# Patient Record
Sex: Male | Born: 1940 | Race: White | Hispanic: No | Marital: Married | Smoking: Former smoker
Health system: Southern US, Community
[De-identification: ages and names within clinical notes are randomized; demographics above are authoritative.]

## PROBLEM LIST (undated history)

## (undated) DIAGNOSIS — I4891 Unspecified atrial fibrillation: Secondary | ICD-10-CM

## (undated) DIAGNOSIS — I10 Essential (primary) hypertension: Secondary | ICD-10-CM

## (undated) DIAGNOSIS — I2510 Atherosclerotic heart disease of native coronary artery without angina pectoris: Secondary | ICD-10-CM

## (undated) DIAGNOSIS — M5136 Other intervertebral disc degeneration, lumbar region: Secondary | ICD-10-CM

## (undated) DIAGNOSIS — E119 Type 2 diabetes mellitus without complications: Secondary | ICD-10-CM

## (undated) DIAGNOSIS — M51369 Other intervertebral disc degeneration, lumbar region without mention of lumbar back pain or lower extremity pain: Secondary | ICD-10-CM

## (undated) HISTORY — PX: ASD REPAIR: SHX258

## (undated) HISTORY — PX: APPENDECTOMY: SHX54

## (undated) HISTORY — PX: HERNIA REPAIR: SHX51

## (undated) HISTORY — PX: PROSTATE SURGERY: SHX751

## (undated) HISTORY — PX: PACEMAKER INSERTION: SHX728

## (undated) HISTORY — PX: CARDIAC ELECTROPHYSIOLOGY MAPPING AND ABLATION: SHX1292

---

## 2011-08-24 DIAGNOSIS — I484 Atypical atrial flutter: Secondary | ICD-10-CM | POA: Insufficient documentation

## 2011-08-24 DIAGNOSIS — I4891 Unspecified atrial fibrillation: Secondary | ICD-10-CM | POA: Insufficient documentation

## 2012-04-25 DIAGNOSIS — G4730 Sleep apnea, unspecified: Secondary | ICD-10-CM | POA: Insufficient documentation

## 2012-04-25 DIAGNOSIS — Z8774 Personal history of (corrected) congenital malformations of heart and circulatory system: Secondary | ICD-10-CM | POA: Insufficient documentation

## 2012-04-25 DIAGNOSIS — Z950 Presence of cardiac pacemaker: Secondary | ICD-10-CM | POA: Insufficient documentation

## 2012-07-13 DIAGNOSIS — F329 Major depressive disorder, single episode, unspecified: Secondary | ICD-10-CM | POA: Insufficient documentation

## 2012-09-13 DIAGNOSIS — E1151 Type 2 diabetes mellitus with diabetic peripheral angiopathy without gangrene: Secondary | ICD-10-CM | POA: Insufficient documentation

## 2012-09-16 DIAGNOSIS — Z7901 Long term (current) use of anticoagulants: Secondary | ICD-10-CM | POA: Insufficient documentation

## 2012-09-16 DIAGNOSIS — I2510 Atherosclerotic heart disease of native coronary artery without angina pectoris: Secondary | ICD-10-CM | POA: Insufficient documentation

## 2015-09-11 DIAGNOSIS — I10 Essential (primary) hypertension: Secondary | ICD-10-CM | POA: Insufficient documentation

## 2015-09-11 DIAGNOSIS — E785 Hyperlipidemia, unspecified: Secondary | ICD-10-CM | POA: Insufficient documentation

## 2015-09-20 DIAGNOSIS — M67911 Unspecified disorder of synovium and tendon, right shoulder: Secondary | ICD-10-CM | POA: Insufficient documentation

## 2015-12-05 DIAGNOSIS — N401 Enlarged prostate with lower urinary tract symptoms: Secondary | ICD-10-CM

## 2015-12-05 DIAGNOSIS — N138 Other obstructive and reflux uropathy: Secondary | ICD-10-CM | POA: Insufficient documentation

## 2016-03-30 DIAGNOSIS — R413 Other amnesia: Secondary | ICD-10-CM | POA: Insufficient documentation

## 2016-03-30 DIAGNOSIS — G250 Essential tremor: Secondary | ICD-10-CM | POA: Insufficient documentation

## 2016-03-31 DIAGNOSIS — M545 Low back pain, unspecified: Secondary | ICD-10-CM | POA: Insufficient documentation

## 2016-03-31 DIAGNOSIS — M79605 Pain in left leg: Secondary | ICD-10-CM | POA: Insufficient documentation

## 2016-04-02 DIAGNOSIS — M7741 Metatarsalgia, right foot: Secondary | ICD-10-CM | POA: Insufficient documentation

## 2016-04-21 ENCOUNTER — Ambulatory Visit (INDEPENDENT_AMBULATORY_CARE_PROVIDER_SITE_OTHER): Payer: Medicare Other | Admitting: Family Medicine

## 2016-04-21 ENCOUNTER — Encounter: Payer: Self-pay | Admitting: Family Medicine

## 2016-04-21 DIAGNOSIS — M5442 Lumbago with sciatica, left side: Secondary | ICD-10-CM

## 2016-04-21 MED ORDER — PREDNISONE 10 MG PO TABS: ORAL_TABLET | ORAL | 0 refills | Status: DC

## 2016-04-21 NOTE — Patient Instructions (Signed)
We are checking with Dr. Garfield CorneaLischke about the prednisone - Ideally we would do this, you'd improve and we would then start physical therapy for your spinal stenosis. It's ok to take tylenol, the muscle relaxant also (though I know these make you sleepy). If we cannot do this we may have to do a CT myelogram of your back though this is a lot of radiation - I'd prefer to avoid this if at all possible.

## 2016-04-22 ENCOUNTER — Ambulatory Visit: Payer: Self-pay | Admitting: Family Medicine

## 2016-04-23 NOTE — Assessment & Plan Note (Signed)
history and exam classic for spinal stenosis - likely from combination of arthritis, disc bulging.  We checked with his PCP about prednisone - they said this was ok and will plan to check his INR this week sometime.  If not improving we can consider a CT myelogram (no MRI with his pacemaker) but discussed this is a lot of radiation, prefer to avoid this.  Follow up in a week and hopefully he's improving and we can start him in physical therapy.

## 2016-04-23 NOTE — Progress Notes (Signed)
PCP: Dr. Elwyn LadeAmie Chan  Subjective:   HPI: Patient is a 75 y.o. male here for low back, left leg pain.  Patient reports for about 6 weeks now he's had relatively severe low back and left leg pain. Pain is 7/10 level, sharp. Started just one morning when he woke up with this pain. Worse with extension of back, better with sitting and leaning forward. No numbness or tingling. Pain goes down to lower thigh level. No skin changes.  No past medical history on file.  No current outpatient prescriptions on file prior to visit.   No current facility-administered medications on file prior to visit.     No past surgical history on file.  Allergies  Allergen Reactions  . Clarithromycin Palpitations and Other (See Comments)    BIAXIN ;  DOES NOT INTERACT WITH CURRENT MEDICATIONS  . Other Other (See Comments)  . Penicillins     Social History   Social History  . Marital status: Married    Spouse name: N/A  . Number of children: N/A  . Years of education: N/A   Occupational History  . Not on file.   Social History Main Topics  . Smoking status: Never Smoker  . Smokeless tobacco: Never Used  . Alcohol use Not on file  . Drug use: Unknown  . Sexual activity: Not on file   Other Topics Concern  . Not on file   Social History Narrative  . No narrative on file    No family history on file.  BP (!) 145/79   Pulse 71   Ht 5\' 9"  (1.753 m)   Wt 186 lb (84.4 kg)   BMI 27.47 kg/m   Review of Systems: See HPI above.    Objective:  Physical Exam:  Gen: NAD, comfortable in exam room  Back: No gross deformity, scoliosis. TTP mildly left lumbar paraspinal region.  No midline or bony TTP. FROM with pain on extension, radiation into left leg. Strength LEs 5/5 all muscle groups. 2+ MSRs in patellar and achilles tendons, equal bilaterally. Mild positive SLR on left, negative right. Sensation intact to light touch bilaterally. Negative logroll bilateral hips Negative  fabers and piriformis stretches.  Assessment & Plan:  1. Low back pain - history and exam classic for spinal stenosis - likely from combination of arthritis, disc bulging.  We checked with his PCP about prednisone - they said this was ok and will plan to check his INR this week sometime.  If not improving we can consider a CT myelogram (no MRI with his pacemaker) but discussed this is a lot of radiation, prefer to avoid this.  Follow up in a week and hopefully he's improving and we can start him in physical therapy.

## 2016-04-29 ENCOUNTER — Encounter: Payer: Self-pay | Admitting: Family Medicine

## 2016-04-29 ENCOUNTER — Ambulatory Visit (INDEPENDENT_AMBULATORY_CARE_PROVIDER_SITE_OTHER): Payer: Medicare Other | Admitting: Family Medicine

## 2016-04-29 ENCOUNTER — Ambulatory Visit (HOSPITAL_BASED_OUTPATIENT_CLINIC_OR_DEPARTMENT_OTHER)
Admission: RE | Admit: 2016-04-29 | Discharge: 2016-04-29 | Disposition: A | Discharge status: Home / Self Care | Payer: Medicare Other | Source: Ambulatory Visit | Attending: Family Medicine | Admitting: Family Medicine

## 2016-04-29 VITALS — BP 124/72 | HR 71 | Ht 69.0 in | Wt 188.0 lb

## 2016-04-29 DIAGNOSIS — M5442 Lumbago with sciatica, left side: Secondary | ICD-10-CM

## 2016-04-29 DIAGNOSIS — M4806 Spinal stenosis, lumbar region: Secondary | ICD-10-CM | POA: Insufficient documentation

## 2016-04-29 DIAGNOSIS — M5126 Other intervertebral disc displacement, lumbar region: Secondary | ICD-10-CM | POA: Insufficient documentation

## 2016-04-29 DIAGNOSIS — I700 Atherosclerosis of aorta: Secondary | ICD-10-CM | POA: Insufficient documentation

## 2016-04-29 DIAGNOSIS — M5136 Other intervertebral disc degeneration, lumbar region: Secondary | ICD-10-CM | POA: Insufficient documentation

## 2016-04-29 NOTE — Patient Instructions (Signed)
We will go ahead with a CT scan of your lumbar spine. I typically call you the business day following this to go over results and next steps. We discussed epidural injection, physical therapy, nortriptyline (a medication) as possibilities in the future.

## 2016-04-30 NOTE — Assessment & Plan Note (Signed)
history and exam classic for spinal stenosis - likely from combination of arthritis, disc bulging.  Only improved for about 1 day with the oral prednisone.  We discussed options again - will start with a standard CT scan to assess for spinal stenosis, ensure no occult compression fracture is present.  We discussed PT, possible injection(s), nortriptyline.

## 2016-04-30 NOTE — Progress Notes (Addendum)
PCP: Dr. Elwyn Lade  Subjective:   HPI: Patient is a 75 y.o. male here for low back, left leg pain.  9/12: Patient reports for about 6 weeks now he's had relatively severe low back and left leg pain. Pain is 7/10 level, sharp. Started just one morning when he woke up with this pain. Worse with extension of back, better with sitting and leaning forward. No numbness or tingling. Pain goes down to lower thigh level. No skin changes.  9/20: Patient reports he feels about the same compared to last visit. Maybe helped the first day by the prednisone only. Can't sleep on left side. Still radiating across his back and down the left leg. No skin changes. Pain 7/10 level, sharp. No numbness.  No past medical history on file.  Current Outpatient Prescriptions on File Prior to Visit  Medication Sig Dispense Refill  . citalopram (CELEXA) 20 MG tablet TK 1 T PO QD  3  . dofetilide (TIKOSYN) 500 MCG capsule Take by mouth.    . losartan (COZAAR) 50 MG tablet   1  . metFORMIN (GLUCOPHAGE-XR) 500 MG 24 hr tablet TAKE 2 TS PO BID  0  . NIFEdipine (PROCARDIA-XL/ADALAT-CC/NIFEDICAL-XL) 30 MG 24 hr tablet   0  . predniSONE (DELTASONE) 10 MG tablet 6 tabs po day 1, 5 tabs po day 2, 4 tabs po day 3, 3 tabs po day 4, 2 tabs po day 5, 1 tab po day 6 21 tablet 0  . simvastatin (ZOCOR) 40 MG tablet   3  . tamsulosin (FLOMAX) 0.4 MG CAPS capsule   0  . warfarin (COUMADIN) 5 MG tablet   0   No current facility-administered medications on file prior to visit.     No past surgical history on file.  Allergies  Allergen Reactions  . Clarithromycin Palpitations and Other (See Comments)    BIAXIN ;  DOES NOT INTERACT WITH CURRENT MEDICATIONS  . Other Other (See Comments)  . Penicillins     Social History   Social History  . Marital status: Married    Spouse name: N/A  . Number of children: N/A  . Years of education: N/A   Occupational History  . Not on file.   Social History Main Topics   . Smoking status: Never Smoker  . Smokeless tobacco: Never Used  . Alcohol use Not on file  . Drug use: Unknown  . Sexual activity: Not on file   Other Topics Concern  . Not on file   Social History Narrative  . No narrative on file    No family history on file.  BP 124/72   Pulse 71   Ht 5\' 9"  (1.753 m)   Wt 188 lb (85.3 kg)   BMI 27.76 kg/m   Review of Systems: See HPI above.    Objective:  Physical Exam:  Gen: NAD, comfortable in exam room  Back: No gross deformity, scoliosis. TTP mildly left lumbar paraspinal region.  No midline or bony TTP. FROM with pain on extension, radiation into left leg. Strength LEs 5/5 all muscle groups. 2+ MSRs in patellar and achilles tendons, equal bilaterally. Mild positive SLR on left, negative right. Sensation intact to light touch bilaterally. Negative logroll bilateral hips Negative fabers and piriformis stretches.  Assessment & Plan:  1. Low back pain - history and exam classic for spinal stenosis - likely from combination of arthritis, disc bulging.  Only improved for about 1 day with the oral prednisone.  We discussed options  again - will start with a standard CT scan to assess for spinal stenosis, ensure no occult compression fracture is present.  We discussed PT, possible injection(s), nortriptyline.    Addendum:  CT reviewed and discussed with patient.  He has a moderate sized disc bulge at L4-5 level with some stenosis here.  Based on his symptoms I suspect this is most likely cause of his current issues.  We again discussed nortriptyline, PT, injection.  He would like to go ahead with injection - will call us a week after this to let us know how he's doing.  Hopefully we can start PT at that time.

## 2016-05-01 ENCOUNTER — Other Ambulatory Visit: Payer: Self-pay | Admitting: *Deleted

## 2016-05-01 ENCOUNTER — Other Ambulatory Visit: Payer: Self-pay | Admitting: Family Medicine

## 2016-05-01 DIAGNOSIS — M5442 Lumbago with sciatica, left side: Secondary | ICD-10-CM

## 2016-05-14 ENCOUNTER — Ambulatory Visit
Admission: RE | Admit: 2016-05-14 | Discharge: 2016-05-14 | Disposition: A | Discharge status: Home / Self Care | Payer: Medicare Other | Source: Ambulatory Visit | Attending: Family Medicine | Admitting: Family Medicine

## 2016-05-14 DIAGNOSIS — M5442 Lumbago with sciatica, left side: Secondary | ICD-10-CM

## 2016-05-14 MED ORDER — IOPAMIDOL (ISOVUE-M 200) INJECTION 41%: 1.0000 mL | Freq: Once | INTRAMUSCULAR | Status: DC

## 2016-05-14 MED ORDER — METHYLPREDNISOLONE ACETATE 40 MG/ML INJ SUSP (RADIOLOG: 120.0000 mg | Freq: Once | INTRAMUSCULAR | Status: DC

## 2016-05-14 NOTE — Discharge Instructions (Signed)

## 2016-05-15 ENCOUNTER — Ambulatory Visit
Admission: RE | Admit: 2016-05-15 | Discharge: 2016-05-15 | Disposition: A | Discharge status: Home / Self Care | Payer: Medicare Other | Source: Ambulatory Visit | Attending: Family Medicine | Admitting: Family Medicine

## 2016-05-15 MED ORDER — IOPAMIDOL (ISOVUE-M 200) INJECTION 41%
1.0000 mL | Freq: Once | INTRAMUSCULAR | Status: AC
Start: 2016-05-15 — End: 2016-05-15
  Administered 2016-05-15: 1 mL via EPIDURAL

## 2016-05-15 MED ORDER — METHYLPREDNISOLONE ACETATE 40 MG/ML INJ SUSP (RADIOLOG
120.0000 mg | Freq: Once | INTRAMUSCULAR | Status: AC
  Administered 2016-05-15: 120 mg via EPIDURAL

## 2016-05-15 NOTE — Discharge Instructions (Addendum)

## 2016-05-27 ENCOUNTER — Telehealth: Payer: Self-pay | Admitting: Family Medicine

## 2016-05-27 NOTE — Telephone Encounter (Signed)
Called PT and appointment has been set.

## 2016-06-12 ENCOUNTER — Telehealth: Payer: Self-pay | Admitting: Family Medicine

## 2016-06-12 DIAGNOSIS — M48062 Spinal stenosis, lumbar region with neurogenic claudication: Secondary | ICD-10-CM | POA: Insufficient documentation

## 2016-06-12 NOTE — Telephone Encounter (Signed)
Spoke to patient and gave him information provided by the physician. Patient would like to do another injection. Will set up for patient.

## 2016-06-12 NOTE — Telephone Encounter (Signed)
I would recommend continuing with the therapy and considering repeating the injection - you can do up to 3 shots spaced 2 weeks apart and some people need multiple injections plus the therapy to get relief.

## 2016-06-15 ENCOUNTER — Telehealth: Payer: Self-pay | Admitting: Family Medicine

## 2016-06-16 ENCOUNTER — Other Ambulatory Visit: Payer: Self-pay | Admitting: Family Medicine

## 2016-06-16 DIAGNOSIS — M48061 Spinal stenosis, lumbar region without neurogenic claudication: Secondary | ICD-10-CM

## 2016-06-16 NOTE — Telephone Encounter (Signed)
Appointment has been set.

## 2016-06-19 ENCOUNTER — Other Ambulatory Visit: Payer: Medicare Other

## 2016-06-19 ENCOUNTER — Ambulatory Visit
Admission: RE | Admit: 2016-06-19 | Discharge: 2016-06-19 | Disposition: A | Discharge status: Home / Self Care | Payer: Medicare Other | Source: Ambulatory Visit | Attending: Family Medicine | Admitting: Family Medicine

## 2016-06-19 DIAGNOSIS — M48061 Spinal stenosis, lumbar region without neurogenic claudication: Secondary | ICD-10-CM

## 2016-06-19 MED ORDER — IOPAMIDOL (ISOVUE-M 200) INJECTION 41%
1.0000 mL | Freq: Once | INTRAMUSCULAR | Status: AC
  Administered 2016-06-19: 1 mL via EPIDURAL

## 2016-06-19 MED ORDER — METHYLPREDNISOLONE ACETATE 40 MG/ML INJ SUSP (RADIOLOG
120.0000 mg | Freq: Once | INTRAMUSCULAR | Status: AC
  Administered 2016-06-19: 120 mg via EPIDURAL

## 2016-07-20 ENCOUNTER — Telehealth: Payer: Self-pay | Admitting: Family Medicine

## 2016-07-20 NOTE — Telephone Encounter (Signed)
Spoke to patient and made him an appointment for his back. Still having some issues.

## 2016-07-21 ENCOUNTER — Ambulatory Visit: Payer: Medicare Other | Admitting: Family Medicine

## 2016-07-30 ENCOUNTER — Encounter: Payer: Self-pay | Admitting: Family Medicine

## 2016-07-30 ENCOUNTER — Ambulatory Visit (INDEPENDENT_AMBULATORY_CARE_PROVIDER_SITE_OTHER): Payer: Medicare Other | Admitting: Family Medicine

## 2016-07-30 DIAGNOSIS — M5442 Lumbago with sciatica, left side: Secondary | ICD-10-CM

## 2016-07-30 DIAGNOSIS — G8929 Other chronic pain: Secondary | ICD-10-CM | POA: Diagnosis not present

## 2016-07-30 NOTE — Patient Instructions (Signed)
Continue the physical therapy and home exercises. Call me if you want to do the third cortisone injection for your back (I'd give it about 2 more weeks of therapy if you can before considering this). The nerve-blocking medications, neurosurgery referral are other considerations. Follow up with me in 6 weeks otherwise but call me sooner about the above.

## 2016-07-31 NOTE — Progress Notes (Signed)
PCP: Dr. Elwyn LadeAmie Chan  Subjective:   HPI: Patient is a 75 y.o. male here for low back, left leg pain.  9/12: Patient reports for about 6 weeks now he's had relatively severe low back and left leg pain. Pain is 7/10 level, sharp. Started just one morning when he woke up with this pain. Worse with extension of back, better with sitting and leaning forward. No numbness or tingling. Pain goes down to lower thigh level. No skin changes.  9/20: Patient reports he feels about the same compared to last visit. Maybe helped the first day by the prednisone only. Can't sleep on left side. Still radiating across his back and down the left leg. No skin changes. Pain 7/10 level, sharp. No numbness.  12/21: Patient reports he has improved since last visit. About 40-50% improved following PT, ESIs x 2. Pain level 3/10, radiates into both legs. Worse in the morning. Eases off with flexion, using shopping cart, and as day goes on. No bowel/bladder dysfunction. No numbness. No skin changes.  No past medical history on file.  Current Outpatient Prescriptions on File Prior to Visit  Medication Sig Dispense Refill  . citalopram (CELEXA) 20 MG tablet TK 1 T PO QD  3  . dofetilide (TIKOSYN) 500 MCG capsule Take by mouth.    . losartan (COZAAR) 50 MG tablet   1  . metFORMIN (GLUCOPHAGE-XR) 500 MG 24 hr tablet TAKE 2 TS PO BID  0  . NIFEdipine (PROCARDIA-XL/ADALAT-CC/NIFEDICAL-XL) 30 MG 24 hr tablet   0  . predniSONE (DELTASONE) 10 MG tablet 6 tabs po day 1, 5 tabs po day 2, 4 tabs po day 3, 3 tabs po day 4, 2 tabs po day 5, 1 tab po day 6 21 tablet 0  . simvastatin (ZOCOR) 40 MG tablet   3  . tamsulosin (FLOMAX) 0.4 MG CAPS capsule   0  . warfarin (COUMADIN) 5 MG tablet   0   No current facility-administered medications on file prior to visit.     No past surgical history on file.  Allergies  Allergen Reactions  . Clarithromycin Palpitations and Other (See Comments)    BIAXIN ;  DOES  NOT INTERACT WITH CURRENT MEDICATIONS  . Other Other (See Comments)  . Penicillins     Social History   Social History  . Marital status: Married    Spouse name: N/A  . Number of children: N/A  . Years of education: N/A   Occupational History  . Not on file.   Social History Main Topics  . Smoking status: Never Smoker  . Smokeless tobacco: Never Used  . Alcohol use Not on file  . Drug use: Unknown  . Sexual activity: Not on file   Other Topics Concern  . Not on file   Social History Narrative  . No narrative on file    No family history on file.  BP (!) 145/78   Pulse 71   Ht 5\' 9"  (1.753 m)   Wt 187 lb (84.8 kg)   BMI 27.62 kg/m   Review of Systems: See HPI above.    Objective:  Physical Exam:  Gen: NAD, comfortable in exam room  Back: No gross deformity, scoliosis. No TTP currently.  No midline or bony TTP. FROM with pain on extension minimally. Strength LEs 5/5 all muscle groups. 2+ MSRs in patellar and achilles tendons, equal bilaterally. Negative SLRs. Sensation intact to light touch bilaterally. Negative logroll bilateral hips  Assessment & Plan:  1.  Low back pain - 2/2 spinal stenosis at L4-5 level with moderate disc bulge.  S/p 2 ESIs and physical therapy.  Continue with home exercises.  He will consider third ESI, nortriptyline, neurosurgery referral if he doesn't continue to improve.  Tylenol if needed.  F/u in 6 weeks otherwise.

## 2016-07-31 NOTE — Assessment & Plan Note (Signed)
2/2 spinal stenosis at L4-5 level with moderate disc bulge.  S/p 2 ESIs and physical therapy.  Continue with home exercises.  He will consider third ESI, nortriptyline, neurosurgery referral if he doesn't continue to improve.  Tylenol if needed.  F/u in 6 weeks otherwise.

## 2016-09-03 ENCOUNTER — Telehealth: Payer: Self-pay | Admitting: Family Medicine

## 2016-09-03 NOTE — Telephone Encounter (Signed)
Ok to go ahead with this - thanks! 

## 2016-09-06 ENCOUNTER — Encounter (HOSPITAL_BASED_OUTPATIENT_CLINIC_OR_DEPARTMENT_OTHER): Payer: Self-pay | Admitting: *Deleted

## 2016-09-06 ENCOUNTER — Emergency Department (HOSPITAL_BASED_OUTPATIENT_CLINIC_OR_DEPARTMENT_OTHER)
Admission: EM | Admit: 2016-09-06 | Discharge: 2016-09-06 | Disposition: A | Discharge status: Home / Self Care | Payer: Medicare Other | Attending: Emergency Medicine | Admitting: Emergency Medicine

## 2016-09-06 DIAGNOSIS — S39012A Strain of muscle, fascia and tendon of lower back, initial encounter: Secondary | ICD-10-CM | POA: Diagnosis not present

## 2016-09-06 DIAGNOSIS — Y939 Activity, unspecified: Secondary | ICD-10-CM | POA: Diagnosis not present

## 2016-09-06 DIAGNOSIS — Z7901 Long term (current) use of anticoagulants: Secondary | ICD-10-CM | POA: Diagnosis not present

## 2016-09-06 DIAGNOSIS — I10 Essential (primary) hypertension: Secondary | ICD-10-CM | POA: Insufficient documentation

## 2016-09-06 DIAGNOSIS — Z79899 Other long term (current) drug therapy: Secondary | ICD-10-CM | POA: Insufficient documentation

## 2016-09-06 DIAGNOSIS — E119 Type 2 diabetes mellitus without complications: Secondary | ICD-10-CM | POA: Diagnosis not present

## 2016-09-06 DIAGNOSIS — W11XXXA Fall on and from ladder, initial encounter: Secondary | ICD-10-CM | POA: Diagnosis not present

## 2016-09-06 DIAGNOSIS — S20222A Contusion of left back wall of thorax, initial encounter: Secondary | ICD-10-CM | POA: Diagnosis not present

## 2016-09-06 DIAGNOSIS — Y929 Unspecified place or not applicable: Secondary | ICD-10-CM | POA: Insufficient documentation

## 2016-09-06 DIAGNOSIS — S3992XA Unspecified injury of lower back, initial encounter: Secondary | ICD-10-CM | POA: Diagnosis present

## 2016-09-06 DIAGNOSIS — Z7984 Long term (current) use of oral hypoglycemic drugs: Secondary | ICD-10-CM | POA: Diagnosis not present

## 2016-09-06 DIAGNOSIS — S5001XA Contusion of right elbow, initial encounter: Secondary | ICD-10-CM | POA: Diagnosis not present

## 2016-09-06 DIAGNOSIS — Y999 Unspecified external cause status: Secondary | ICD-10-CM | POA: Diagnosis not present

## 2016-09-06 DIAGNOSIS — S40021A Contusion of right upper arm, initial encounter: Secondary | ICD-10-CM

## 2016-09-06 DIAGNOSIS — Z5181 Encounter for therapeutic drug level monitoring: Secondary | ICD-10-CM | POA: Diagnosis not present

## 2016-09-06 HISTORY — DX: Essential (primary) hypertension: I10

## 2016-09-06 HISTORY — DX: Type 2 diabetes mellitus without complications: E11.9

## 2016-09-06 HISTORY — DX: Atherosclerotic heart disease of native coronary artery without angina pectoris: I25.10

## 2016-09-06 HISTORY — DX: Unspecified atrial fibrillation: I48.91

## 2016-09-06 HISTORY — DX: Other intervertebral disc degeneration, lumbar region without mention of lumbar back pain or lower extremity pain: M51.369

## 2016-09-06 HISTORY — DX: Other intervertebral disc degeneration, lumbar region: M51.36

## 2016-09-06 LAB — CBC WITH DIFFERENTIAL/PLATELET
Basophils Absolute: 0 10*3/uL (ref 0.0–0.1)
Basophils Relative: 0 %
EOS ABS: 0.2 10*3/uL (ref 0.0–0.7)
EOS PCT: 3 %
HCT: 36.4 % — ABNORMAL LOW (ref 39.0–52.0)
Hemoglobin: 11.8 g/dL — ABNORMAL LOW (ref 13.0–17.0)
LYMPHS ABS: 2 10*3/uL (ref 0.7–4.0)
LYMPHS PCT: 29 %
MCH: 30.6 pg (ref 26.0–34.0)
MCHC: 32.4 g/dL (ref 30.0–36.0)
MCV: 94.3 fL (ref 78.0–100.0)
MONO ABS: 1 10*3/uL (ref 0.1–1.0)
MONOS PCT: 15 %
Neutro Abs: 3.5 10*3/uL (ref 1.7–7.7)
Neutrophils Relative %: 53 %
PLATELETS: 263 10*3/uL (ref 150–400)
RBC: 3.86 MIL/uL — ABNORMAL LOW (ref 4.22–5.81)
RDW: 13.1 % (ref 11.5–15.5)
WBC: 6.7 10*3/uL (ref 4.0–10.5)

## 2016-09-06 LAB — BASIC METABOLIC PANEL
Anion gap: 6 (ref 5–15)
BUN: 17 mg/dL (ref 6–20)
CO2: 28 mmol/L (ref 22–32)
CREATININE: 0.7 mg/dL (ref 0.61–1.24)
Calcium: 8.8 mg/dL — ABNORMAL LOW (ref 8.9–10.3)
Chloride: 103 mmol/L (ref 101–111)
GFR calc Af Amer: >60 mL/min (ref >60)
GLUCOSE: 119 mg/dL — AB (ref 65–99)
POTASSIUM: 4.2 mmol/L (ref 3.5–5.1)
SODIUM: 137 mmol/L (ref 135–145)

## 2016-09-06 LAB — PROTIME-INR
INR: 1.83
PROTHROMBIN TIME: 21.4 s — AB (ref 11.4–15.2)

## 2016-09-06 MED ORDER — ACETAMINOPHEN 500 MG PO TABS
1000.0000 mg | ORAL_TABLET | Freq: Once | ORAL | Status: AC
  Administered 2016-09-06: 1000 mg via ORAL
  Filled 2016-09-06: qty 2

## 2016-09-06 NOTE — ED Notes (Signed)
ED Provider at bedside. 

## 2016-09-06 NOTE — ED Triage Notes (Signed)
Patient states he has a recent diagnosis of herniated disk in his back.  Yesterday stepped off of the last step of a ladder, fell backwards landing on a piece of equipment.  Now has worse back pain, right elbow pain and generalized soreness.

## 2016-09-06 NOTE — ED Notes (Signed)
Pt states fall was approx 2 feet off the ground. Pt thought he was at the bottom and stepped down off the ladder, became off balanced and fell on top of some equipment. No neuro deficits distally, denies incontinence, no point tenderness of spine

## 2016-09-06 NOTE — ED Provider Notes (Signed)
[facility] Provider Note   CSN: [id number] Arrival date & time: 09/06/16  4098     History   Chief Complaint Chief Complaint  Patient presents with  . Fall    HPI Paul Chan is a 76 y.o. male.  The history is provided by the patient.  Fall  This is a new problem. The current episode started 12 to 24 hours ago. The problem occurs constantly. The problem has not changed since onset.Associated symptoms comments: Back pain, right shoulder and arm pain. Nothing aggravates the symptoms. Nothing relieves the symptoms. He has tried acetaminophen for the symptoms. The treatment provided no relief.    Past Medical History:  Diagnosis Date  . A-fib (HCC)   . Coronary artery disease   . DDD (degenerative disc disease), lumbar   . Diabetes mellitus without complication (HCC)   . Hypertension     Patient Active Problem List   Diagnosis Date Noted  . Spinal stenosis of lumbar region with neurogenic claudication 06/12/2016  . Metatarsalgia of right foot 04/02/2016  . Low back pain 03/31/2016  . Leg pain, left 03/31/2016  . Memory impairment 03/30/2016  . 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 Dysfunction of right rotator cuff 09/20/2015  . Hyperlipidemia 09/11/2015  . HTN (hypertension), benign 09/11/2015  . Anticoagulated on Coumadin 09/16/2012  . CAD (coronary artery disease) 09/16/2012  . Type 2 diabetes mellitus (HCC) 09/13/2012  . Depression 07/13/2012  . Sleep apnea 04/25/2012  . H/O atrial septal defect repair 04/25/2012  . Cardiac pacemaker 04/25/2012  . Atrial fibrillation (HCC) 08/24/2011  . Atypical atrial flutter (HCC) 08/24/2011    Past Surgical History:  Procedure Laterality Date  . APPENDECTOMY    . ASD REPAIR    . CARDIAC ELECTROPHYSIOLOGY MAPPING AND ABLATION    . HERNIA REPAIR    . PACEMAKER INSERTION         Home Medications    Prior to Admission medications     Medication Sig Start Date End Date Taking? Authorizing Provider  citalopram (CELEXA) 20 MG tablet TK 1 T PO QD 02/09/16  Yes Historical Provider, MD  dofetilide (TIKOSYN) 500 MCG capsule Take by mouth.   Yes Historical Provider, MD  losartan (COZAAR) 50 MG tablet  03/04/16  Yes Historical Provider, MD  metFORMIN (GLUCOPHAGE-XR) 500 MG 24 hr tablet TAKE 2 TS PO BID 04/14/16  Yes Historical Provider, MD  NIFEdipine (PROCARDIA-XL/ADALAT-CC/NIFEDICAL-XL) 30 MG 24 hr tablet  04/14/16  Yes Historical Provider, MD  simvastatin (ZOCOR) 40 MG tablet  02/10/16  Yes Historical Provider, MD  warfarin (COUMADIN) 5 MG tablet  02/16/16  Yes Historical Provider, MD  predniSONE (DELTASONE) 10 MG tablet 6 tabs po day 1, 5 tabs po day 2, 4 tabs po day 3, 3 tabs po day 4, 2 tabs po day 5, 1 tab po day 6 04/21/16   Lenda Kelp, MD  tamsulosin (FLOMAX) 0.4 MG CAPS capsule  01/21/16   Historical Provider, MD    Family History No family history on file.  Social History Social History  Substance Use Topics  . Smoking status: Never Smoker  . Smokeless tobacco: Never Used  . Alcohol use No     Allergies   Clarithromycin; Other; and Penicillins   Review of Systems Review of Systems  All other systems reviewed and are negative.    Physical Exam Updated Vital Signs BP 143/66 (BP Location: Right Arm)   Pulse 70  Temp 98.7 F (37.1 C) (Oral)   Resp 18   Ht 5\' 9"  (1.753 m)   Wt 180 lb (81.6 kg)   SpO2 98%   BMI 26.58 kg/m   Physical Exam  Constitutional: He is oriented to person, place, and time. He appears well-developed and well-nourished. No distress.  HENT:  Head: Normocephalic and atraumatic.  Nose: Nose normal.  Eyes: Conjunctivae are normal.  Neck: Neck supple. No tracheal deviation present.  Cardiovascular: Normal rate and regular rhythm.   Pulmonary/Chest: Effort normal. No respiratory distress.  Abdominal: Soft. He exhibits no distension.  Musculoskeletal:  Right sided low back  tenderness without ecchymosis, deformity. Right elbow tenderness with small contusion, no decreased ROM or deformity  Neurological: He is alert and oriented to person, place, and time.  Skin: Skin is warm and dry.  Psychiatric: He has a normal mood and affect.     ED Treatments / Results  Labs (all labs ordered are listed, but only abnormal results are displayed) Labs Reviewed  CBC WITH DIFFERENTIAL/PLATELET - Abnormal; Notable for the following:       Result Value   RBC 3.86 (*)    Hemoglobin 11.8 (*)    HCT 36.4 (*)    All other components within normal limits  BASIC METABOLIC PANEL - Abnormal; Notable for the following:    Glucose, Bld 119 (*)    Calcium 8.8 (*)    All other components within normal limits  PROTIME-INR - Abnormal; Notable for the following:    Prothrombin Time 21.4 (*)    All other components within normal limits    EKG  EKG Interpretation None       Radiology No results found.  Procedures Procedures (including critical care time)  Medications Ordered in ED Medications - No data to display   Initial Impression / Assessment and Plan / ED Course  I have reviewed the triage vital signs and the nursing notes.  Pertinent labs & imaging results that were available during my care of the patient were reviewed by me and considered in my medical decision making (see chart for details).     76 y.o. male presents with fall off of a stepladder yesterday. anticagulated on coumadin. No head injury but has contusions evident to arm and back. Discussed possibility of early retroperitoneal hematoma but after discussion Pt deferred imaging and preferred expectant management. INR subtherapeutic. Discussed anticoagulation clinic f/u and f/u with PCP for recheck with supportive care measures for soft-tissue injury in interim. Return precautions discussed for worsening or new concerning symptoms.   Final Clinical Impressions(s) / ED Diagnoses   Final diagnoses:   Low back strain, initial encounter  Contusion of left side of back, initial encounter  Arm contusion, right, initial encounter    New Prescriptions Discharge Medication List as of 09/06/2016  1:41 PM       Lyndal Pulleyaniel Bryann Gentz, MD 09/07/16 1458

## 2016-09-10 NOTE — Telephone Encounter (Signed)
Information faxed. Waiting to hear about appointment.

## 2017-03-04 ENCOUNTER — Ambulatory Visit (INDEPENDENT_AMBULATORY_CARE_PROVIDER_SITE_OTHER): Payer: Medicare Other | Admitting: Family Medicine

## 2017-03-04 ENCOUNTER — Encounter: Payer: Self-pay | Admitting: Family Medicine

## 2017-03-04 DIAGNOSIS — M659 Synovitis and tenosynovitis, unspecified: Secondary | ICD-10-CM | POA: Diagnosis present

## 2017-03-04 NOTE — Patient Instructions (Signed)
You have a flexor tendinitis and tenosynovitis of your hand. You were given an injection today. Icing 15 minutes at a time 3-4 times a day at least. Simple motion exercises and consider strengthening with a stress ball or putty. Occupational therapy is an option if you're not improving. Follow up with me in 1 month for reevaluation.

## 2017-03-06 DIAGNOSIS — M659 Synovitis and tenosynovitis, unspecified: Secondary | ICD-10-CM | POA: Insufficient documentation

## 2017-03-06 NOTE — Assessment & Plan Note (Signed)
flexor tenosynovitis and tendinitis.  We discussed options.  Icing, motion exercises.  Tendon sheath injection given of 3rd digit.  F/u in 1 month.  Consider repeat injection, occupational therapy if not improving.  After informed written consent patient was sitting in chair in exam room.  Area overlying right flexor tendon sheath at level of A1 pulley prepped with alcohol swab then injected with 0.5:0.145mL bupivicaine:depomedrol.  Patient tolerated procedure well without immediate complications.

## 2017-03-06 NOTE — Progress Notes (Signed)
PCP: Bing MatterLischke, Aimee S., MD  Subjective:   HPI: Patient is a 76 y.o. male here for right hand pain.  Patient reports about 1 month ago he started to get volar right hand pain. Associated with problems fully bending and straightening index and middle fingers. He is right handed. Pain level up to 5/10, a stiffness. Difficulty making a fist. No acute injury or trauma. No skin changes, redness.  Past Medical History:  Diagnosis Date  . A-fib (HCC)   . Coronary artery disease   . DDD (degenerative disc disease), lumbar   . Diabetes mellitus without complication (HCC)   . Hypertension     Current Outpatient Prescriptions on File Prior to Visit  Medication Sig Dispense Refill  . citalopram (CELEXA) 20 MG tablet TK 1 T PO QD  3  . dofetilide (TIKOSYN) 500 MCG capsule Take by mouth.    . losartan (COZAAR) 50 MG tablet   1  . metFORMIN (GLUCOPHAGE-XR) 500 MG 24 hr tablet TAKE 2 TS PO BID  0  . NIFEdipine (PROCARDIA-XL/ADALAT-CC/NIFEDICAL-XL) 30 MG 24 hr tablet   0  . simvastatin (ZOCOR) 40 MG tablet   3  . tamsulosin (FLOMAX) 0.4 MG CAPS capsule   0  . warfarin (COUMADIN) 5 MG tablet   0   No current facility-administered medications on file prior to visit.     Past Surgical History:  Procedure Laterality Date  . APPENDECTOMY    . ASD REPAIR    . CARDIAC ELECTROPHYSIOLOGY MAPPING AND ABLATION    . HERNIA REPAIR    . PACEMAKER INSERTION      Allergies  Allergen Reactions  . Clarithromycin Palpitations and Other (See Comments)    BIAXIN ;  DOES NOT INTERACT WITH CURRENT MEDICATIONS  . Other Other (See Comments)  . Penicillins     Social History   Social History  . Marital status: Married    Spouse name: N/A  . Number of children: N/A  . Years of education: N/A   Occupational History  . Not on file.   Social History Main Topics  . Smoking status: Never Smoker  . Smokeless tobacco: Never Used  . Alcohol use No  . Drug use: No  . Sexual activity: Not on file    Other Topics Concern  . Not on file   Social History Narrative  . No narrative on file    No family history on file.  BP 134/86   Pulse 70   Ht 5\' 9"  (1.753 m)   Wt 191 lb (86.6 kg)   BMI 28.21 kg/m   Review of Systems: See HPI above.     Objective:  Physical Exam:  Gen: NAD, comfortable in exam room  Right hand/wrist: No malrotation, angulation of digits.  No bruising.  Mild swelling of 3rd and 2nd digits.  No other deformity. TTP flexor tendons of 2nd, 3rd digits. Full extension of all digits.  Lacks mild flexion of 3rd digit moreso than 2nd digit.  Collateral ligaments intact of all joints of digits. Negative tinels. Sensation intact to light touch.  Left hand/wrist: FROM without pain.  MSK u/s right hand:  Moderate tenosynovitis noted of flexor tendon 3rd digit.  Trace of 2nd digit.  No tendon abnormalities.  No other abnormalities noted volar hand.  Assessment & Plan:  1. Right hand pain - 2/2 flexor tenosynovitis and tendinitis.  We discussed options.  Icing, motion exercises.  Tendon sheath injection given of 3rd digit.  F/u in 1 month.  Consider repeat injection, occupational therapy if not improving.  After informed written consent patient was sitting in chair in exam room.  Area overlying right flexor tendon sheath at level of A1 pulley prepped with alcohol swab then injected with 0.5:0.635mL bupivicaine:depomedrol.  Patient tolerated procedure well without immediate complications.

## 2017-04-05 ENCOUNTER — Encounter: Payer: Self-pay | Admitting: Family Medicine

## 2017-04-05 ENCOUNTER — Ambulatory Visit (INDEPENDENT_AMBULATORY_CARE_PROVIDER_SITE_OTHER): Payer: Medicare Other | Admitting: Family Medicine

## 2017-04-05 DIAGNOSIS — M659 Synovitis and tenosynovitis, unspecified: Secondary | ICD-10-CM | POA: Diagnosis not present

## 2017-04-05 NOTE — Assessment & Plan Note (Signed)
2/2 flexor tenosynovitis and tendinitis.  Significantly improved following injection.  Icing, motion exercises.  Tylenol if needed.  F/u prn.

## 2017-04-05 NOTE — Progress Notes (Signed)
PCP: Bing Matter., MD  Subjective:   HPI: Patient is a 76 y.o. male here for right hand pain.  7/26: Patient reports about 1 month ago he started to get volar right hand pain. Associated with problems fully bending and straightening index and middle fingers. He is right handed. Pain level up to 5/10, a stiffness. Difficulty making a fist. No acute injury or trauma. No skin changes, redness.  8/27: Patient reports he's doing well. Finger much improved. Had been soaking with cold water. Pain down to 1/10 level at most, a stiffness at times. Can make a fist now and fully straighten fingers. No skin changes, numbness. Unfortunately he lost his wife less than 2 weeks ago - we talked about this a little and I offered him to call me if he needs anything going forward - has a good support system as his 5 children live close.  Past Medical History:  Diagnosis Date  . A-fib (HCC)   . Coronary artery disease   . DDD (degenerative disc disease), lumbar   . Diabetes mellitus without complication (HCC)   . Hypertension     Current Outpatient Prescriptions on File Prior to Visit  Medication Sig Dispense Refill  . citalopram (CELEXA) 20 MG tablet TK 1 T PO QD  3  . dofetilide (TIKOSYN) 500 MCG capsule Take by mouth.    . losartan (COZAAR) 50 MG tablet   1  . metFORMIN (GLUCOPHAGE-XR) 500 MG 24 hr tablet TAKE 2 TS PO BID  0  . NIFEdipine (PROCARDIA-XL/ADALAT-CC/NIFEDICAL-XL) 30 MG 24 hr tablet   0  . simvastatin (ZOCOR) 40 MG tablet   3  . tamsulosin (FLOMAX) 0.4 MG CAPS capsule   0  . warfarin (COUMADIN) 5 MG tablet   0   No current facility-administered medications on file prior to visit.     Past Surgical History:  Procedure Laterality Date  . APPENDECTOMY    . ASD REPAIR    . CARDIAC ELECTROPHYSIOLOGY MAPPING AND ABLATION    . HERNIA REPAIR    . PACEMAKER INSERTION      Allergies  Allergen Reactions  . Clarithromycin Palpitations and Other (See Comments)    BIAXIN  ;  DOES NOT INTERACT WITH CURRENT MEDICATIONS  . Other Other (See Comments)  . Penicillins     Social History   Social History  . Marital status: Married    Spouse name: N/A  . Number of children: N/A  . Years of education: N/A   Occupational History  . Not on file.   Social History Main Topics  . Smoking status: Never Smoker  . Smokeless tobacco: Never Used  . Alcohol use No  . Drug use: No  . Sexual activity: Not on file   Other Topics Concern  . Not on file   Social History Narrative  . No narrative on file    No family history on file.  BP (!) 151/83   Pulse 71   Ht 5\' 9"  (1.753 m)   Wt 185 lb (83.9 kg)   BMI 27.32 kg/m   Review of Systems: See HPI above.     Objective:  Physical Exam:  Gen: NAD, comfortable in exam room  Right hand/wrist: No malrotation, angulation of digits.  No bruising, swelling, other deformity. No TTP flexor tendons of 2nd, 3rd digits. FROM all digits now. 5/5 strength flexion and extension of MCP, PIP, DIP joints 3rd digit. Collateral ligaments intact of all joints of digits. Sensation intact to light  touch.  Left hand/wrist: FROM without pain.  Assessment & Plan:  1. Right hand pain - 2/2 flexor tenosynovitis and tendinitis.  Significantly improved following injection.  Icing, motion exercises.  Tylenol if needed.  F/u prn.

## 2017-05-02 ENCOUNTER — Encounter (HOSPITAL_BASED_OUTPATIENT_CLINIC_OR_DEPARTMENT_OTHER): Payer: Self-pay | Admitting: Emergency Medicine

## 2017-05-02 ENCOUNTER — Emergency Department (HOSPITAL_BASED_OUTPATIENT_CLINIC_OR_DEPARTMENT_OTHER)
Admission: EM | Admit: 2017-05-02 | Discharge: 2017-05-02 | Disposition: A | Discharge status: Home / Self Care | Payer: Medicare Other | Attending: Physician Assistant | Admitting: Physician Assistant

## 2017-05-02 DIAGNOSIS — Y929 Unspecified place or not applicable: Secondary | ICD-10-CM | POA: Insufficient documentation

## 2017-05-02 DIAGNOSIS — I2510 Atherosclerotic heart disease of native coronary artery without angina pectoris: Secondary | ICD-10-CM | POA: Diagnosis not present

## 2017-05-02 DIAGNOSIS — I10 Essential (primary) hypertension: Secondary | ICD-10-CM | POA: Insufficient documentation

## 2017-05-02 DIAGNOSIS — Z950 Presence of cardiac pacemaker: Secondary | ICD-10-CM | POA: Diagnosis not present

## 2017-05-02 DIAGNOSIS — E119 Type 2 diabetes mellitus without complications: Secondary | ICD-10-CM | POA: Diagnosis not present

## 2017-05-02 DIAGNOSIS — Y999 Unspecified external cause status: Secondary | ICD-10-CM | POA: Diagnosis not present

## 2017-05-02 DIAGNOSIS — S51811A Laceration without foreign body of right forearm, initial encounter: Secondary | ICD-10-CM | POA: Insufficient documentation

## 2017-05-02 DIAGNOSIS — Y939 Activity, unspecified: Secondary | ICD-10-CM | POA: Diagnosis not present

## 2017-05-02 DIAGNOSIS — W541XXA Struck by dog, initial encounter: Secondary | ICD-10-CM | POA: Diagnosis not present

## 2017-05-02 DIAGNOSIS — Z7984 Long term (current) use of oral hypoglycemic drugs: Secondary | ICD-10-CM | POA: Diagnosis not present

## 2017-05-02 DIAGNOSIS — Z79899 Other long term (current) drug therapy: Secondary | ICD-10-CM | POA: Insufficient documentation

## 2017-05-02 DIAGNOSIS — Z7901 Long term (current) use of anticoagulants: Secondary | ICD-10-CM | POA: Insufficient documentation

## 2017-05-02 DIAGNOSIS — Z23 Encounter for immunization: Secondary | ICD-10-CM | POA: Diagnosis not present

## 2017-05-02 DIAGNOSIS — S6991XA Unspecified injury of right wrist, hand and finger(s), initial encounter: Secondary | ICD-10-CM | POA: Diagnosis present

## 2017-05-02 MED ORDER — TETANUS-DIPHTH-ACELL PERTUSSIS 5-2.5-18.5 LF-MCG/0.5 IM SUSP
0.5000 mL | Freq: Once | INTRAMUSCULAR | Status: AC
  Administered 2017-05-02: 0.5 mL via INTRAMUSCULAR
  Filled 2017-05-02: qty 0.5

## 2017-05-02 NOTE — ED Provider Notes (Signed)
[facility] Provider Note   CSN: [id number] Arrival date & time: 05/02/17  1320     History   Chief Complaint Chief Complaint  Patient presents with  . Laceration    HPI Paul Chan is a 76 y.o. male.  HPI  Patient is a 76 year old male presenting with laceration to his right wrist after a boxer jumped up to say hello. On blood thinners  Skin tear to the right wrist. Unknown last tetanus.  Past Medical History:  Diagnosis Date  . A-fib (HCC)   . Coronary artery disease   . DDD (degenerative disc disease), lumbar   . Diabetes mellitus without complication (HCC)   . Hypertension     Patient Active Problem List   Diagnosis Date Noted  . Flexor tenosynovitis of finger 03/06/2017  . Spinal stenosis of lumbar region with neurogenic claudication 06/12/2016  . Metatarsalgia of right foot 04/02/2016  . Low back pain 03/31/2016  . Leg pain, left 03/31/2016  . Memory impairment 03/30/2016  . 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 Dysfunction of right rotator cuff 09/20/2015  . Hyperlipidemia 09/11/2015  . HTN (hypertension), benign 09/11/2015  . Anticoagulated on Coumadin 09/16/2012  . CAD (coronary artery disease) 09/16/2012  . Type 2 diabetes mellitus with diabetic peripheral angiopathy without gangrene, without long-term current use of insulin (HCC) 09/13/2012  . Depression 07/13/2012  . Sleep apnea 04/25/2012  . H/O atrial septal defect repair 04/25/2012  . Cardiac pacemaker 04/25/2012  . Atrial fibrillation (HCC) 08/24/2011  . Atypical atrial flutter (HCC) 08/24/2011    Past Surgical History:  Procedure Laterality Date  . APPENDECTOMY    . ASD REPAIR    . CARDIAC ELECTROPHYSIOLOGY MAPPING AND ABLATION    . HERNIA REPAIR    . PACEMAKER INSERTION         Home Medications    Prior to Admission medications   Medication Sig Start Date End Date Taking? Authorizing Provider    citalopram (CELEXA) 20 MG tablet TK 1 T PO QD 02/09/16   [provider]  dofetilide (TIKOSYN) 500 MCG capsule Take by mouth.    [provider]  losartan (COZAAR) 50 MG tablet  03/04/16   [provider]  metFORMIN (GLUCOPHAGE-XR) 500 MG 24 hr tablet TAKE 2 TS PO BID 04/14/16   [provider]  NIFEdipine (PROCARDIA-XL/ADALAT-CC/NIFEDICAL-XL) 30 MG 24 hr tablet  04/14/16   [provider]  simvastatin (ZOCOR) 40 MG tablet  02/10/16   [provider]  tamsulosin (FLOMAX) 0.4 MG CAPS capsule  01/21/16   [provider]  warfarin (COUMADIN) 5 MG tablet  02/16/16   [provider]    Family History No family history on file.  Social History Social History  Substance Use Topics  . Smoking status: Never Smoker  . Smokeless tobacco: Never Used  . Alcohol use No     Allergies   Clarithromycin; Other; and Penicillins   Review of Systems Review of Systems  Constitutional: Negative for activity change.  Respiratory: Negative for shortness of breath.   Cardiovascular: Negative for chest pain.     Physical Exam Updated Vital Signs BP (!) 156/69 (BP Location: Left Arm)   Pulse 70   Temp 98.1 F (36.7 C) (Oral)   Resp 16   SpO2 100%   Physical Exam  Constitutional: He is oriented to person, place, and time. He appears well-nourished.  HENT:  Head: Normocephalic.  Eyes: Conjunctivae are  normal.  Cardiovascular: Normal rate.   Pulmonary/Chest: Effort normal.  Musculoskeletal:  Skin tear to inner right forearm, superficial no bleeding.  Neurological: He is oriented to person, place, and time.  Skin: Skin is warm and dry. He is not diaphoretic.  Psychiatric: He has a normal mood and affect. His behavior is normal.     ED Treatments / Results  Labs (all labs ordered are listed, but only abnormal results are displayed) Labs Reviewed - No data to display  EKG  EKG Interpretation None       Radiology No  results found.  Procedures Procedures (including critical care time)  Medications Ordered in ED Medications  Tdap (BOOSTRIX) injection 0.5 mL (not administered)     Initial Impression / Assessment and Plan / ED Course  I have reviewed the triage vital signs and the nursing notes.  Pertinent labs & imaging results that were available during my care of the patient were reviewed by me and considered in my medical decision making (see chart for details).   Skin tear had stopped bleeding after nursing had done a great job putting a bandage on it. We will teach patient how to use Coban, pressure dressing and update his tetanus.  Final Clinical Impressions(s) / ED Diagnoses   Final diagnoses:  None    New Prescriptions New Prescriptions   No medications on file     Abelino Derrick, MD 05/02/17 1439

## 2017-05-02 NOTE — ED Triage Notes (Signed)
Pt c/o skin tear to RT wrist from dog jumping on him yesterday (approx 24 hrs ago); on blood thinner and is concerned because wound still bleeding.

## 2017-05-02 NOTE — Discharge Instructions (Signed)
Please return with any concerns. °

## 2017-05-02 NOTE — ED Notes (Signed)
Wound dressed with gauze and coban per MD verbal order.

## 2017-05-05 ENCOUNTER — Ambulatory Visit (INDEPENDENT_AMBULATORY_CARE_PROVIDER_SITE_OTHER): Payer: Medicare Other | Admitting: Family Medicine

## 2017-05-05 ENCOUNTER — Encounter: Payer: Self-pay | Admitting: Family Medicine

## 2017-05-05 DIAGNOSIS — S4991XA Unspecified injury of right shoulder and upper arm, initial encounter: Secondary | ICD-10-CM | POA: Diagnosis present

## 2017-05-05 NOTE — Patient Instructions (Addendum)
You have a proximal biceps tendon tear. These are treated conservatively. Icing or heat only if needed. Start light weight bicep curls and hammer rotations - work your way up to 3 sets of 10 once a day for the next 6 weeks. Call me if you're having any problems otherwise follow up with me as needed.

## 2017-05-06 DIAGNOSIS — S4991XA Unspecified injury of right shoulder and upper arm, initial encounter: Secondary | ICD-10-CM | POA: Insufficient documentation

## 2017-05-06 NOTE — Progress Notes (Signed)
PCP: Bing Matter., MD  Subjective:   HPI: Patient is a 76 y.o. male here for right arm injury.  Patient reports 1 month ago he was picking up his dog who weighs around 18-20 pounds when he felt and heard a pop in proximal-mid biceps area. Immediate pain and developed some bruising here. After a couple days felt like pain improved - was sharp in same area and bothered some with lifting at that time. He is right handed. Pain now 0/10. Not taking anything for this. No weakness now. No skin changes, numbness.  Past Medical History:  Diagnosis Date  . A-fib (HCC)   . Coronary artery disease   . DDD (degenerative disc disease), lumbar   . Diabetes mellitus without complication (HCC)   . Hypertension     Current Outpatient Prescriptions on File Prior to Visit  Medication Sig Dispense Refill  . citalopram (CELEXA) 20 MG tablet TK 1 T PO QD  3  . dofetilide (TIKOSYN) 500 MCG capsule Take by mouth.    . losartan (COZAAR) 50 MG tablet   1  . metFORMIN (GLUCOPHAGE-XR) 500 MG 24 hr tablet TAKE 2 TS PO BID  0  . NIFEdipine (PROCARDIA-XL/ADALAT-CC/NIFEDICAL-XL) 30 MG 24 hr tablet   0  . simvastatin (ZOCOR) 40 MG tablet   3  . tamsulosin (FLOMAX) 0.4 MG CAPS capsule   0  . warfarin (COUMADIN) 5 MG tablet   0   No current facility-administered medications on file prior to visit.     Past Surgical History:  Procedure Laterality Date  . APPENDECTOMY    . ASD REPAIR    . CARDIAC ELECTROPHYSIOLOGY MAPPING AND ABLATION    . HERNIA REPAIR    . PACEMAKER INSERTION      Allergies  Allergen Reactions  . Clarithromycin Palpitations and Other (See Comments)    BIAXIN ;  DOES NOT INTERACT WITH CURRENT MEDICATIONS  . Other Other (See Comments)  . Penicillins     Social History   Social History  . Marital status: Married    Spouse name: N/A  . Number of children: N/A  . Years of education: N/A   Occupational History  . Not on file.   Social History Main Topics  . Smoking  status: Never Smoker  . Smokeless tobacco: Never Used  . Alcohol use No  . Drug use: No  . Sexual activity: Not on file   Other Topics Concern  . Not on file   Social History Narrative  . No narrative on file    No family history on file.  BP (!) 164/80   Pulse 72   Ht  (1.753 m)   Wt 183 lb (83 kg)   BMI 27.02 kg/m   Review of Systems: See HPI above.     Objective:  Physical Exam:  Gen: NAD, comfortable in exam room  Right shoulder/elbow: No swelling, ecchymoses.  Mild popeye deformity. No TTP. FROM. Negative Hawkins, Neers. Negative Yergasons. Strength 5/5 with empty can and resisted internal/external rotation. Negative apprehension. Negative hook of biceps tendon. NV intact distally.  Left shoulder: No swelling, ecchymoses.  No gross deformity. No TTP. FROM. Strength 5/5 with empty can and resisted internal/external rotation. NV intact distally.   Assessment & Plan:  1. Right shoulder injury - 2/2 proximal biceps tendon rupture. Reassured patient.  Ice or heat if needed.  Tylenol if needed.  Shown home exercise program to do daily and how to advance these.  Call us if  has any problems otherwise follow up as needed.  Total visit time 15 minutes - >50% of which spent on counseling.

## 2017-05-06 NOTE — Assessment & Plan Note (Signed)
2/2 proximal biceps tendon rupture. Reassured patient.  Ice or heat if needed.  Tylenol if needed.  Shown home exercise program to do daily and how to advance these.  Call us if has any problems otherwise follow up as needed.  Total visit time 15 minutes - >50% of which spent on counseling.

## 2017-05-10 IMAGING — XA Imaging study
2 series · 2 of 2 positions shown · non-contrast
Comparison: none

CLINICAL DATA: Spondylosis without myelopathy. Spinal stenosis.
Left leg and thigh pain, L3 distribution.

[Series 1: ortho standard · 1 of 1 slices shown (1 of 2)]
[im 1/1]
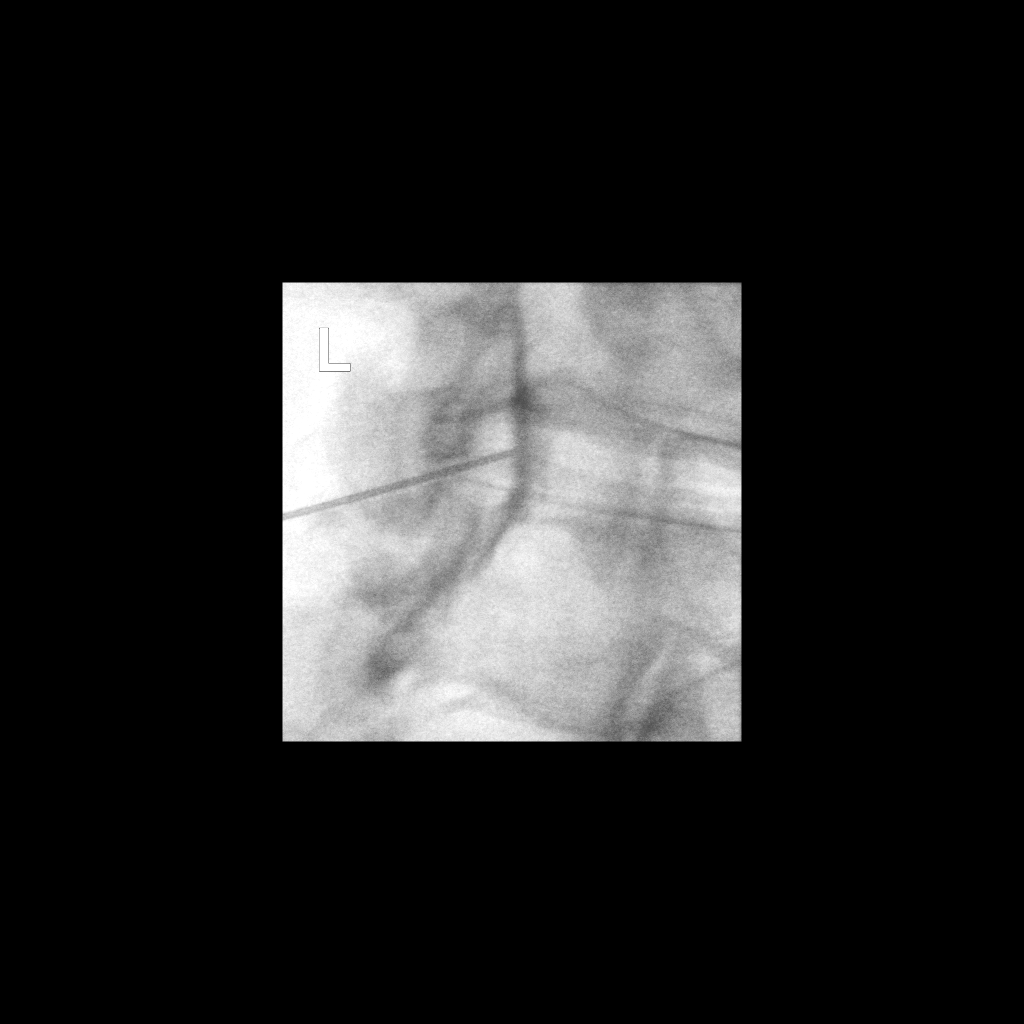

[Series 2: ortho standard · 1 of 1 slices shown (2 of 2)]
[im 1/1]
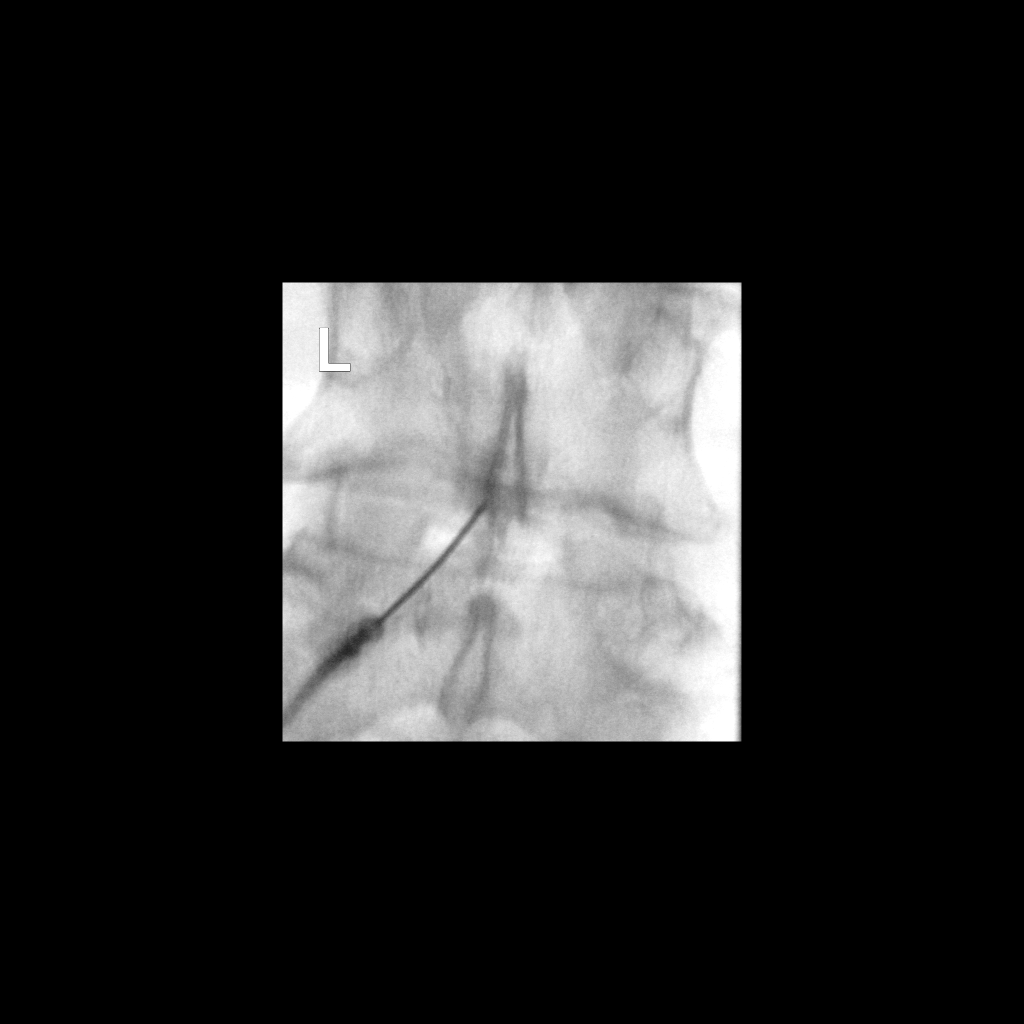

[2 of 2 positions shown; findings below may reference images not displayed]

FLUOROSCOPY TIME:  0 minutes 20 seconds. 26.14 micro gray meter
squared

PROCEDURE:
The procedure, risks, benefits, and alternatives were explained to
the patient. Questions regarding the procedure were encouraged and
answered. The patient understands and consents to the procedure.

LUMBAR EPIDURAL INJECTION:

An interlaminar approach was performed on left at L4-5. The
overlying skin was cleansed and anesthetized. A 20 gauge epidural
needle was advanced using loss-of-resistance technique.

DIAGNOSTIC EPIDURAL INJECTION:

Injection of Isovue-M 200 shows a good epidural pattern with spread
above and below the level of needle placement, primarily on the left
no vascular opacification is seen.

THERAPEUTIC EPIDURAL INJECTION:

One hundred twenty Mg of Depo-Medrol mixed with 2.5 cc 1% lidocaine
were instilled. The procedure was well-tolerated, and the patient
was discharged thirty minutes following the injection in good
condition.

COMPLICATIONS:
None
IMPRESSION: Technically successful epidural injection on the left at L4-5 # 1.

## 2017-11-08 ENCOUNTER — Encounter: Payer: Self-pay | Admitting: Family Medicine

## 2017-11-08 ENCOUNTER — Ambulatory Visit (INDEPENDENT_AMBULATORY_CARE_PROVIDER_SITE_OTHER): Payer: Medicare Other | Admitting: Family Medicine

## 2017-11-08 DIAGNOSIS — G8929 Other chronic pain: Secondary | ICD-10-CM

## 2017-11-08 DIAGNOSIS — M5442 Lumbago with sciatica, left side: Secondary | ICD-10-CM

## 2017-11-08 NOTE — Patient Instructions (Signed)
You have spinal stenosis. Take tylenol for baseline pain relief (1-2 extra strength tabs 3x/day) as needed. Ok to take your muscle relaxant as needed too. Stay as active as possible. Physical therapy has been shown to be helpful as well - start this and do home exercises on days you don't go to therapy. Strengthening of low back muscles, abdominal musculature are key for long term pain relief. We will consider repeat epidural injections, prednisone dose pack, CT myelogram if not improving as expected. Follow up with me in 1 month.

## 2017-11-09 ENCOUNTER — Encounter: Payer: Self-pay | Admitting: Family Medicine

## 2017-11-09 NOTE — Assessment & Plan Note (Signed)
secondary to known spinal stenosis at L4-5 level with moderate disc bulge.  A year and a half ago he did 2 epidural steroid injections and physical therapy and had good results.  He will start with physical therapy again and do home exercises on days he does not go to therapy.  Tylenol as needed.  Follow-up with us in 1 month.  If not improved we will consider repeat epidural injections, prednisone Dosepak, CT myelogram.

## 2017-11-09 NOTE — Progress Notes (Signed)
PCP: Dr. Elwyn LadeAmie Lischke  Subjective:   HPI: Patient is a 77 y.o. male here for low back, left leg pain.  9/12: Patient reports for about 6 weeks now he's had relatively severe low back and left leg pain. Pain is 7/10 level, sharp. Started just one morning when he woke up with this pain. Worse with extension of back, better with sitting and leaning forward. No numbness or tingling. Pain goes down to lower thigh level. No skin changes.  9/20: Patient reports he feels about the same compared to last visit. Maybe helped the first day by the prednisone only. Can't sleep on left side. Still radiating across his back and down the left leg. No skin changes. Pain 7/10 level, sharp. No numbness.  07/30/16: Patient reports he has improved since last visit. About 40-50% improved following PT, ESIs x 2. Pain level 3/10, radiates into both legs. Worse in the morning. Eases off with flexion, using shopping cart, and as day goes on. No bowel/bladder dysfunction. No numbness. No skin changes.  11/08/17: Overall patient has been doing well until about the past week. He states that starting last week he began developing worsening left-sided low back pain at a 5 out of 10 level worse with extension. She tried a back brace and had a couple pills of muscle relaxant. He has not tried any other medications.  He states swelling is not as bad, better when moving around. Vitamin leaning forward against the cart in the grocery store. No bowel or bladder dysfunction. Does radiate some into the left leg and minimally into the right leg. No skin changes or numbness.  Past Medical History:  Diagnosis Date  . A-fib (HCC)   . Coronary artery disease   . DDD (degenerative disc disease), lumbar   . Diabetes mellitus without complication (HCC)   . Hypertension     Current Outpatient Medications on File Prior to Visit  Medication Sig Dispense Refill  . citalopram (CELEXA) 20 MG tablet TK 1 T PO QD  3   . dofetilide (TIKOSYN) 500 MCG capsule Take by mouth.    . losartan (COZAAR) 50 MG tablet   1  . metFORMIN (GLUCOPHAGE-XR) 500 MG 24 hr tablet TAKE 2 TS PO BID  0  . NIFEdipine (PROCARDIA-XL/ADALAT-CC/NIFEDICAL-XL) 30 MG 24 hr tablet   0  . simvastatin (ZOCOR) 40 MG tablet   3  . tamsulosin (FLOMAX) 0.4 MG CAPS capsule   0  . warfarin (COUMADIN) 5 MG tablet   0   No current facility-administered medications on file prior to visit.     Past Surgical History:  Procedure Laterality Date  . APPENDECTOMY    . ASD REPAIR    . CARDIAC ELECTROPHYSIOLOGY MAPPING AND ABLATION    . HERNIA REPAIR    . PACEMAKER INSERTION      Allergies  Allergen Reactions  . Clarithromycin Palpitations and Other (See Comments)    BIAXIN ;  DOES NOT INTERACT WITH CURRENT MEDICATIONS  . Other Other (See Comments)  . Penicillins     Social History   Socioeconomic History  . Marital status: Married    Spouse name: Not on file  . Number of children: Not on file  . Years of education: Not on file  . Highest education level: Not on file  Occupational History  . Not on file  Social Needs  . Financial resource strain: Not on file  . Food insecurity:    Worry: Not on file    Inability:  Not on file  . Transportation needs:    Medical: Not on file    Non-medical: Not on file  Tobacco Use  . Smoking status: Never Smoker  . Smokeless tobacco: Never Used  Substance and Sexual Activity  . Alcohol use: No  . Drug use: No  . Sexual activity: Not on file  Lifestyle  . Physical activity:    Days per week: Not on file    Minutes per session: Not on file  . Stress: Not on file  Relationships  . Social connections:    Talks on phone: Not on file    Gets together: Not on file    Attends religious service: Not on file    Active member of club or organization: Not on file    Attends meetings of clubs or organizations: Not on file    Relationship status: Not on file  . Intimate partner violence:    Fear  of current or ex partner: Not on file    Emotionally abused: Not on file    Physically abused: Not on file    Forced sexual activity: Not on file  Other Topics Concern  . Not on file  Social History Narrative  . Not on file    History reviewed. No pertinent family history.  BP 116/71   Pulse 76   Ht 5\' 9"  (1.753 m)   Wt 180 lb (81.6 kg)   BMI 26.58 kg/m   Review of Systems: See HPI above.    Objective:  Physical Exam:  Gen: NAD, comfortable in exam room.  Back: No gross deformity, scoliosis. Mild TTP left lumbar paraspinal region, buttock, lateral hip.  No midline or bony TTP. FROM with mild pain on extension. Strength LEs 5/5 all muscle groups.   2+ MSRs in patellar and achilles tendons, equal bilaterally. Negative SLRs. Sensation intact to light touch bilaterally.  Bilateral hips: No deformity. Mild tenderness left lateral hip.  No other tenderness.  Mild limitation IR and ER but no pain.  Strength 5/5. Negative logroll bilateral hips Negative fabers and piriformis stretches. NVI distally.  Assessment & Plan:  1. Low back pain -secondary to known spinal stenosis at L4-5 level with moderate disc bulge.  A year and a half ago he did 2 epidural steroid injections and physical therapy and had good results.  He will start with physical therapy again and do home exercises on days he does not go to therapy.  Tylenol as needed.  Follow-up with [place] in 1 month.  If not improved we will consider repeat epidural injections, prednisone Dosepak, CT myelogram.

## 2017-12-09 ENCOUNTER — Ambulatory Visit (INDEPENDENT_AMBULATORY_CARE_PROVIDER_SITE_OTHER): Payer: Medicare Other | Admitting: Family Medicine

## 2017-12-09 ENCOUNTER — Encounter: Payer: Self-pay | Admitting: Family Medicine

## 2017-12-09 DIAGNOSIS — M5442 Lumbago with sciatica, left side: Secondary | ICD-10-CM | POA: Diagnosis present

## 2017-12-09 DIAGNOSIS — G8929 Other chronic pain: Secondary | ICD-10-CM | POA: Diagnosis not present

## 2017-12-09 NOTE — Patient Instructions (Signed)
You have spinal stenosis. Take tylenol for baseline pain relief (1-2 extra strength tabs 3x/day) as needed. Ok to take your muscle relaxant as needed too. Stay as active as possible. Continue with physical therapy and do home exercises on days you don't go to therapy. Strengthening of low back muscles, abdominal musculature are key for long term pain relief. We will consider repeat epidural injections, prednisone dose pack, CT myelogram if not improving as expected. Follow up with me in 6 weeks.

## 2017-12-11 ENCOUNTER — Encounter: Payer: Self-pay | Admitting: Family Medicine

## 2017-12-11 NOTE — Progress Notes (Signed)
PCP: Dr. Elwyn Lade  Subjective:   HPI: Patient is a 77 y.o. male here for low back, left leg pain.  9/12: Patient reports for about 6 weeks now he's had relatively severe low back and left leg pain. Pain is 7/10 level, sharp. Started just one morning when he woke up with this pain. Worse with extension of back, better with sitting and leaning forward. No numbness or tingling. Pain goes down to lower thigh level. No skin changes.  9/20: Patient reports he feels about the same compared to last visit. Maybe helped the first day by the prednisone only. Can't sleep on left side. Still radiating across his back and down the left leg. No skin changes. Pain 7/10 level, sharp. No numbness.  07/30/16: Patient reports he has improved since last visit. About 40-50% improved following PT, ESIs x 2. Pain level 3/10, radiates into both legs. Worse in the morning. Eases off with flexion, using shopping cart, and as day goes on. No bowel/bladder dysfunction. No numbness. No skin changes.  11/08/17: Overall patient has been doing well until about the past week. He states that starting last week he began developing worsening left-sided low back pain at a 5 out of 10 level worse with extension. She tried a back brace and had a couple pills of muscle relaxant. He has not tried any other medications.  He states swelling is not as bad, better when moving around. Vitamin leaning forward against the cart in the grocery store. No bowel or bladder dysfunction. Does radiate some into the left leg and minimally into the right leg. No skin changes or numbness.  5/2: Patient reports he is doing a little better. Doing physical therapy and home exercises. Pain is better during the day. Pain level 4/10, can be sharp going into left leg to the thigh. No bowel/bladder dysfunction.  Past Medical History:  Diagnosis Date  . A-fib (HCC)   . Coronary artery disease   . DDD (degenerative disc  disease), lumbar   . Diabetes mellitus without complication (HCC)   . Hypertension     Current Outpatient Medications on File Prior to Visit  Medication Sig Dispense Refill  . citalopram (CELEXA) 20 MG tablet TK 1 T PO QD  3  . dofetilide (TIKOSYN) 500 MCG capsule Take by mouth.    . losartan (COZAAR) 50 MG tablet   1  . metFORMIN (GLUCOPHAGE-XR) 500 MG 24 hr tablet TAKE 2 TS PO BID  0  . NIFEdipine (PROCARDIA-XL/ADALAT-CC/NIFEDICAL-XL) 30 MG 24 hr tablet   0  . simvastatin (ZOCOR) 40 MG tablet   3  . tamsulosin (FLOMAX) 0.4 MG CAPS capsule   0  . warfarin (COUMADIN) 5 MG tablet   0   No current facility-administered medications on file prior to visit.     Past Surgical History:  Procedure Laterality Date  . APPENDECTOMY    . ASD REPAIR    . CARDIAC ELECTROPHYSIOLOGY MAPPING AND ABLATION    . HERNIA REPAIR    . PACEMAKER INSERTION      Allergies  Allergen Reactions  . Clarithromycin Palpitations and Other (See Comments)    BIAXIN ;  DOES NOT INTERACT WITH CURRENT MEDICATIONS  . Other Other (See Comments)  . Penicillins     Social History   Socioeconomic History  . Marital status: Married    Spouse name: Not on file  . Number of children: Not on file  . Years of education: Not on file  . Highest education  level: Not on file  Occupational History  . Not on file  Social Needs  . Financial resource strain: Not on file  . Food insecurity:    Worry: Not on file    Inability: Not on file  . Transportation needs:    Medical: Not on file    Non-medical: Not on file  Tobacco Use  . Smoking status: Never Smoker  . Smokeless tobacco: Never Used  Substance and Sexual Activity  . Alcohol use: No  . Drug use: No  . Sexual activity: Not on file  Lifestyle  . Physical activity:    Days per week: Not on file    Minutes per session: Not on file  . Stress: Not on file  Relationships  . Social connections:    Talks on phone: Not on file    Gets together: Not on file     Attends religious service: Not on file    Active member of club or organization: Not on file    Attends meetings of clubs or organizations: Not on file    Relationship status: Not on file  . Intimate partner violence:    Fear of current or ex partner: Not on file    Emotionally abused: Not on file    Physically abused: Not on file    Forced sexual activity: Not on file  Other Topics Concern  . Not on file  Social History Narrative  . Not on file    History reviewed. No pertinent family history.  BP 126/72   Pulse 71   Ht  (1.753 m)   Wt 182 lb (82.6 kg)   BMI 26.88 kg/m   Review of Systems: See HPI above.    Objective:  Physical Exam:  Gen: NAD, comfortable in exam room  Back: No gross deformity, scoliosis. TTP mildly left buttock, left paraspinal lumbar region .  No midline or bony TTP. FROM with mild pain on extension. Strength LEs 5/5 all muscle groups.   2+ MSRs in patellar and achilles tendons, equal bilaterally. Negative SLRs. Sensation intact to light touch bilaterally. Negative logroll bilateral hips Negative fabers and piriformis stretches.  Assessment & Plan:  1. 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  He is improving and happy with his progress.  Will continue PT and home exercises.  Tylenol for baseline pain relief.  Consider ESIs if not improving, prednisone dose pack, CT myelogram.  F/u in 6 weeks.

## 2017-12-11 NOTE — Assessment & Plan Note (Signed)
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  He is improving and happy with his progress.  Will continue PT and home exercises.  Tylenol for baseline pain relief.  Consider ESIs if not improving, prednisone dose pack, CT myelogram.  F/u in 6 weeks.

## 2018-01-20 ENCOUNTER — Ambulatory Visit: Payer: Medicare Other | Admitting: Family Medicine

## 2018-01-21 ENCOUNTER — Ambulatory Visit: Payer: Medicare Other | Admitting: Family Medicine

## 2018-01-25 ENCOUNTER — Encounter: Payer: Self-pay | Admitting: Family Medicine

## 2018-01-25 ENCOUNTER — Ambulatory Visit (INDEPENDENT_AMBULATORY_CARE_PROVIDER_SITE_OTHER): Payer: Medicare Other | Admitting: Family Medicine

## 2018-01-25 DIAGNOSIS — G8929 Other chronic pain: Secondary | ICD-10-CM

## 2018-01-25 DIAGNOSIS — M5442 Lumbago with sciatica, left side: Secondary | ICD-10-CM | POA: Diagnosis present

## 2018-01-25 NOTE — Assessment & Plan Note (Signed)
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  Doing well following PT and home exercises.  Tylenol only if needed.  Continue home exercises.  F/u prn.

## 2018-01-25 NOTE — Progress Notes (Signed)
PCP: Dr. Elwyn LadeAmie Chan  Subjective:   HPI: Patient is a 77 y.o. male here for low back, left leg pain.  9/12: Patient reports for about 6 weeks now he's had relatively severe low back and left leg pain. Pain is 7/10 level, sharp. Started just one morning when he woke up with this pain. Worse with extension of back, better with sitting and leaning forward. No numbness or tingling. Pain goes down to lower thigh level. No skin changes.  9/20: Patient reports he feels about the same compared to last visit. Maybe helped the first day by the prednisone only. Can't sleep on left side. Still radiating across his back and down the left leg. No skin changes. Pain 7/10 level, sharp. No numbness.  07/30/16: Patient reports he has improved since last visit. About 40-50% improved following PT, ESIs x 2. Pain level 3/10, radiates into both legs. Worse in the morning. Eases off with flexion, using shopping cart, and as day goes on. No bowel/bladder dysfunction. No numbness. No skin changes.  11/08/17: Overall patient has been doing well until about the past week. He states that starting last week he began developing worsening left-sided low back pain at a 5 out of 10 level worse with extension. She tried a back brace and had a couple pills of muscle relaxant. He has not tried any other medications.  He states swelling is not as bad, better when moving around. Vitamin leaning forward against the cart in the grocery store. No bowel or bladder dysfunction. Does radiate some into the left leg and minimally into the right leg. No skin changes or numbness.  5/2: Patient reports he is doing a little better. Doing physical therapy and home exercises. Pain is better during the day. Pain level 4/10, can be sharp going into left leg to the thigh. No bowel/bladder dysfunction.  6/18: Patient reports he's doing very well. Done with physical therapy and continues to do his home exercises. Not  taking any medicines for his back. No bowel/bladder dysfunction. Pain level 1/10 and a soreness at worst.  Past Medical History:  Diagnosis Date  . A-fib (HCC)   . Coronary artery disease   . DDD (degenerative disc disease), lumbar   . Diabetes mellitus without complication (HCC)   . Hypertension     Current Outpatient Medications on File Prior to Visit  Medication Sig Dispense Refill  . citalopram (CELEXA) 20 MG tablet TK 1 T PO QD  3  . dofetilide (TIKOSYN) 500 MCG capsule Take by mouth.    . losartan (COZAAR) 50 MG tablet   1  . metFORMIN (GLUCOPHAGE-XR) 500 MG 24 hr tablet TAKE 2 TS PO BID  0  . NIFEdipine (PROCARDIA-XL/ADALAT-CC/NIFEDICAL-XL) 30 MG 24 hr tablet   0  . simvastatin (ZOCOR) 40 MG tablet   3  . tamsulosin (FLOMAX) 0.4 MG CAPS capsule   0  . warfarin (COUMADIN) 5 MG tablet   0   No current facility-administered medications on file prior to visit.     Past Surgical History:  Procedure Laterality Date  . APPENDECTOMY    . ASD REPAIR    . CARDIAC ELECTROPHYSIOLOGY MAPPING AND ABLATION    . HERNIA REPAIR    . PACEMAKER INSERTION      Allergies  Allergen Reactions  . Clarithromycin Palpitations and Other (See Comments)    BIAXIN ;  DOES NOT INTERACT WITH CURRENT MEDICATIONS  . Other Other (See Comments)  . Penicillins     Social History  Socioeconomic History  . Marital status: Married    Spouse name: Not on file  . Number of children: Not on file  . Years of education: Not on file  . Highest education level: Not on file  Occupational History  . Not on file  Social Needs  . Financial resource strain: Not on file  . Food insecurity:    Worry: Not on file    Inability: Not on file  . Transportation needs:    Medical: Not on file    Non-medical: Not on file  Tobacco Use  . Smoking status: Never Smoker  . Smokeless tobacco: Never Used  Substance and Sexual Activity  . Alcohol use: No  . Drug use: No  . Sexual activity: Not on file   Lifestyle  . Physical activity:    Days per week: Not on file    Minutes per session: Not on file  . Stress: Not on file  Relationships  . Social connections:    Talks on phone: Not on file    Gets together: Not on file    Attends religious service: Not on file    Active member of club or organization: Not on file    Attends meetings of clubs or organizations: Not on file    Relationship status: Not on file  . Intimate partner violence:    Fear of current or ex partner: Not on file    Emotionally abused: Not on file    Physically abused: Not on file    Forced sexual activity: Not on file  Other Topics Concern  . Not on file  Social History Narrative  . Not on file    History reviewed. No pertinent family history.  BP (!) 150/74   Pulse 70   Ht 5\' 8"  (1.727 m)   Wt 182 lb 6.4 oz (82.7 kg)   BMI 27.73 kg/m   Review of Systems: See HPI above.    Objective:  Physical Exam:  Gen: NAD, comfortable in exam room  Back: No gross deformity, scoliosis. No paraspinal TTP .  No midline or bony TTP. FROM without pain. Strength LEs 5/5 all muscle groups.   2+ MSRs in patellar and achilles tendons, equal bilaterally. Negative SLRs. Sensation intact to light touch bilaterally. Negative logroll bilateral hips  Assessment & Plan:  1. 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  Doing well following PT and home exercises.  Tylenol only if needed.  Continue home exercises.  F/u prn.

## 2018-05-24 ENCOUNTER — Ambulatory Visit (INDEPENDENT_AMBULATORY_CARE_PROVIDER_SITE_OTHER): Payer: Medicare Other | Admitting: Family Medicine

## 2018-05-24 ENCOUNTER — Encounter: Payer: Self-pay | Admitting: Family Medicine

## 2018-05-24 VITALS — BP 138/80 | HR 70 | Ht 69.0 in | Wt 183.0 lb

## 2018-05-24 DIAGNOSIS — M79645 Pain in left finger(s): Secondary | ICD-10-CM | POA: Diagnosis present

## 2018-05-24 NOTE — Patient Instructions (Signed)
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 These are the different medications you can take for this: Tylenol 500mg  1-2 tabs three times a day for pain. Capsaicin, aspercreme, or biofreeze topically up to four times a day may also help with pain. Some supplements that may help for arthritis: Boswellia extract, curcumin, pycnogenol Cortisone injections are an option if pain is severe. Try soaking in warm water to loosen this up when you're going to be using your hands. It's important that you continue to stay active. Follow up with me as needed otherwise.

## 2018-05-24 NOTE — Progress Notes (Signed)
PCP: Bing Matter., MD  Subjective:   HPI: Patient is a 77 y.o. male here for left index finger pain.  Patient reports he's had problems with left index finger for several months. Slowly increased swelling, difficulty making a fist and grabbing things. Pain level 4-5/10 and a stiffness. No skin changes, numbness.  Past Medical History:  Diagnosis Date  . A-fib (HCC)   . Coronary artery disease   . DDD (degenerative disc disease), lumbar   . Diabetes mellitus without complication (HCC)   . Hypertension     Current Outpatient Medications on File Prior to Visit  Medication Sig Dispense Refill  . citalopram (CELEXA) 20 MG tablet TK 1 T PO QD  3  . dofetilide (TIKOSYN) 500 MCG capsule Take by mouth.    . losartan (COZAAR) 50 MG tablet   1  . metFORMIN (GLUCOPHAGE-XR) 500 MG 24 hr tablet TAKE 2 TS PO BID  0  . NIFEdipine (PROCARDIA-XL/ADALAT-CC/NIFEDICAL-XL) 30 MG 24 hr tablet   0  . simvastatin (ZOCOR) 40 MG tablet   3  . tamsulosin (FLOMAX) 0.4 MG CAPS capsule   0  . warfarin (COUMADIN) 5 MG tablet   0   No current facility-administered medications on file prior to visit.     Past Surgical History:  Procedure Laterality Date  . APPENDECTOMY    . ASD REPAIR    . CARDIAC ELECTROPHYSIOLOGY MAPPING AND ABLATION    . HERNIA REPAIR    . PACEMAKER INSERTION      Allergies  Allergen Reactions  . Clarithromycin Palpitations and Other (See Comments)    BIAXIN ;  DOES NOT INTERACT WITH CURRENT MEDICATIONS  . Other Other (See Comments)  . Penicillins     Social History   Socioeconomic History  . Marital status: Married    Spouse name: Not on file  . Number of children: Not on file  . Years of education: Not on file  . Highest education level: Not on file  Occupational History  . Not on file  Social Needs  . Financial resource strain: Not on file  . Food insecurity:    Worry: Not on file    Inability: Not on file  . Transportation needs:    Medical: Not on file     Non-medical: Not on file  Tobacco Use  . Smoking status: Never Smoker  . Smokeless tobacco: Never Used  Substance and Sexual Activity  . Alcohol use: No  . Drug use: No  . Sexual activity: Not on file  Lifestyle  . Physical activity:    Days per week: Not on file    Minutes per session: Not on file  . Stress: Not on file  Relationships  . Social connections:    Talks on phone: Not on file    Gets together: Not on file    Attends religious service: Not on file    Active member of club or organization: Not on file    Attends meetings of clubs or organizations: Not on file    Relationship status: Not on file  . Intimate partner violence:    Fear of current or ex partner: Not on file    Emotionally abused: Not on file    Physically abused: Not on file    Forced sexual activity: Not on file  Other Topics Concern  . Not on file  Social History Narrative  . Not on file    History reviewed. No pertinent family history.  BP 138/80  Pulse 70   Ht 5\' 9"  (1.753 m)   Wt 183 lb (83 kg)   BMI 27.02 kg/m   Review of Systems: See HPI above.     Objective:  Physical Exam:  Gen: NAD, comfortable in exam room  Left hand/2nd digit: Mild swelling throughout digit.  No malrotation, angulation.  No deformity. Mild limitation flexion at PIP, DIP, MCP joints.  Full extension. Collateral ligaments intact. No TTP throughout digit. NVI distally.  Right hand/2nd digit: No deformity. FROM with 5/5 strength. No tenderness to palpation. NVI distally.   MSK u/s left 2nd digit:  Flexor and extensor tendons intact - no increased fluid in tendon sheath compared to right 2nd digit.  No cortical irregularities to suggest fracture.  Arthropathy at PIP and DIP joints noted dorsally.  Assessment & Plan:  1. 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  Discussed tylenol, topical medications, supplements that may help, warm soaks.  Consider injection if struggling.  F/u  prn.

## 2018-08-10 ENCOUNTER — Emergency Department (HOSPITAL_BASED_OUTPATIENT_CLINIC_OR_DEPARTMENT_OTHER): Payer: Medicare Other

## 2018-08-10 ENCOUNTER — Encounter (HOSPITAL_BASED_OUTPATIENT_CLINIC_OR_DEPARTMENT_OTHER): Payer: Self-pay | Admitting: Emergency Medicine

## 2018-08-10 ENCOUNTER — Other Ambulatory Visit: Payer: Self-pay

## 2018-08-10 ENCOUNTER — Emergency Department (HOSPITAL_BASED_OUTPATIENT_CLINIC_OR_DEPARTMENT_OTHER)
Admission: EM | Admit: 2018-08-10 | Discharge: 2018-08-10 | Disposition: A | Discharge status: Home / Self Care | Payer: Medicare Other | Attending: Emergency Medicine | Admitting: Emergency Medicine

## 2018-08-10 DIAGNOSIS — I2510 Atherosclerotic heart disease of native coronary artery without angina pectoris: Secondary | ICD-10-CM | POA: Diagnosis not present

## 2018-08-10 DIAGNOSIS — Z7901 Long term (current) use of anticoagulants: Secondary | ICD-10-CM | POA: Diagnosis not present

## 2018-08-10 DIAGNOSIS — R5383 Other fatigue: Secondary | ICD-10-CM | POA: Diagnosis not present

## 2018-08-10 DIAGNOSIS — E1151 Type 2 diabetes mellitus with diabetic peripheral angiopathy without gangrene: Secondary | ICD-10-CM | POA: Insufficient documentation

## 2018-08-10 DIAGNOSIS — I4891 Unspecified atrial fibrillation: Secondary | ICD-10-CM | POA: Insufficient documentation

## 2018-08-10 DIAGNOSIS — R69 Illness, unspecified: Secondary | ICD-10-CM

## 2018-08-10 DIAGNOSIS — Z87891 Personal history of nicotine dependence: Secondary | ICD-10-CM | POA: Diagnosis not present

## 2018-08-10 DIAGNOSIS — J111 Influenza due to unidentified influenza virus with other respiratory manifestations: Secondary | ICD-10-CM

## 2018-08-10 DIAGNOSIS — I10 Essential (primary) hypertension: Secondary | ICD-10-CM | POA: Insufficient documentation

## 2018-08-10 DIAGNOSIS — Z7984 Long term (current) use of oral hypoglycemic drugs: Secondary | ICD-10-CM | POA: Insufficient documentation

## 2018-08-10 DIAGNOSIS — I4892 Unspecified atrial flutter: Secondary | ICD-10-CM | POA: Insufficient documentation

## 2018-08-10 DIAGNOSIS — J069 Acute upper respiratory infection, unspecified: Secondary | ICD-10-CM

## 2018-08-10 DIAGNOSIS — R52 Pain, unspecified: Secondary | ICD-10-CM | POA: Diagnosis present

## 2018-08-10 LAB — COMPREHENSIVE METABOLIC PANEL
ALK PHOS: 39 U/L (ref 38–126)
ALT: 20 U/L (ref 0–44)
ANION GAP: 10 (ref 5–15)
AST: 29 U/L (ref 15–41)
Albumin: 3.8 g/dL (ref 3.5–5.0)
BILIRUBIN TOTAL: 0.7 mg/dL (ref 0.3–1.2)
BUN: 22 mg/dL (ref 8–23)
CALCIUM: 8.4 mg/dL — AB (ref 8.9–10.3)
CO2: 23 mmol/L (ref 22–32)
Chloride: 100 mmol/L (ref 98–111)
Creatinine, Ser: 0.98 mg/dL (ref 0.61–1.24)
GFR calc non Af Amer: >60 mL/min (ref >60)
Glucose, Bld: 192 mg/dL — ABNORMAL HIGH (ref 70–99)
POTASSIUM: 4.3 mmol/L (ref 3.5–5.1)
SODIUM: 133 mmol/L — AB (ref 135–145)
TOTAL PROTEIN: 6.8 g/dL (ref 6.5–8.1)

## 2018-08-10 LAB — CBC WITH DIFFERENTIAL/PLATELET
Abs Immature Granulocytes: 0.04 10*3/uL (ref 0.00–0.07)
Basophils Absolute: 0 10*3/uL (ref 0.0–0.1)
Basophils Relative: 0 %
EOS ABS: 0 10*3/uL (ref 0.0–0.5)
EOS PCT: 0 %
HCT: 36.6 % — ABNORMAL LOW (ref 39.0–52.0)
HEMOGLOBIN: 11.6 g/dL — AB (ref 13.0–17.0)
Immature Granulocytes: 0 %
LYMPHS PCT: 11 %
Lymphs Abs: 1.2 10*3/uL (ref 0.7–4.0)
MCH: 30 pg (ref 26.0–34.0)
MCHC: 31.7 g/dL (ref 30.0–36.0)
MCV: 94.6 fL (ref 80.0–100.0)
MONO ABS: 1.4 10*3/uL — AB (ref 0.1–1.0)
Monocytes Relative: 13 %
NRBC: 0 % (ref 0.0–0.2)
Neutro Abs: 8.3 10*3/uL — ABNORMAL HIGH (ref 1.7–7.7)
Neutrophils Relative %: 76 %
Platelets: 221 10*3/uL (ref 150–400)
RBC: 3.87 MIL/uL — AB (ref 4.22–5.81)
RDW: 13.4 % (ref 11.5–15.5)
WBC: 10.9 10*3/uL — AB (ref 4.0–10.5)

## 2018-08-10 LAB — INFLUENZA PANEL BY PCR (TYPE A & B)
INFLAPCR: NEGATIVE
INFLBPCR: POSITIVE — AB

## 2018-08-10 LAB — PROTIME-INR
INR: 2.06
PROTHROMBIN TIME: 23 s — AB (ref 11.4–15.2)

## 2018-08-10 LAB — GROUP A STREP BY PCR: Group A Strep by PCR: NOT DETECTED

## 2018-08-10 MED ORDER — OSELTAMIVIR PHOSPHATE 75 MG PO CAPS: 75.0000 mg | ORAL_CAPSULE | Freq: Two times a day (BID) | ORAL | 0 refills | Status: AC

## 2018-08-10 NOTE — ED Triage Notes (Signed)
"  Head cold" x3 days.  Weak and body aches. Fever 101 last night.

## 2018-08-10 NOTE — ED Notes (Signed)
ED Provider at bedside. 

## 2018-08-10 NOTE — ED Provider Notes (Signed)
[facility] EMERGENCY DEPARTMENT Provider Note   CSN: [id number] Arrival date & time: 08/10/18  4742     History   Chief Complaint Chief Complaint  Patient presents with  . URI    HPI Paul Chan is a 78 y.o. male.  HPI   Monday started getting head cold symptoms, felt a little better yesterday AM, then last night started to feel worse Body aches, fever, fever was 101 at home, took tylenol This AM woke up and didn't have energy Sore throat yesterday afternoon, improved some today, using throat spray which is helping Also has severe congestion and rhinorrhea Is sneezing, mild cough No shortness of breath, no chest pain Feel nausea, no vomiting No diarrhea No ear pain Has had contact with people who have been sick but not much contact Flu shot this year  No black or bloody stools  Past Medical History:  Diagnosis Date  . A-fib (HCC)   . Coronary artery disease   . DDD (degenerative disc disease), lumbar   . Diabetes mellitus without complication (HCC)   . Hypertension     Patient Active Problem List   Diagnosis Date Noted  . Right shoulder injury, initial encounter 05/06/2017  . Flexor tenosynovitis of finger 03/06/2017  . Spinal stenosis of lumbar region with neurogenic claudication 06/12/2016  . Metatarsalgia of right foot 04/02/2016  . Low back pain 03/31/2016  . Leg pain, left 03/31/2016  . Memory impairment 03/30/2016  . 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 Dysfunction of right rotator cuff 09/20/2015  . Hyperlipidemia 09/11/2015  . HTN (hypertension), benign 09/11/2015  . Anticoagulated on Coumadin 09/16/2012  . CAD (coronary artery disease) 09/16/2012  . Type 2 diabetes mellitus with diabetic peripheral angiopathy without gangrene, without long-term current use of insulin (HCC) 09/13/2012  . Depression 07/13/2012  . Sleep apnea 04/25/2012  . H/O atrial septal defect repair  04/25/2012  . Cardiac pacemaker 04/25/2012  . Atrial fibrillation (HCC) 08/24/2011  . Atypical atrial flutter (HCC) 08/24/2011    Past Surgical History:  Procedure Laterality Date  . APPENDECTOMY    . ASD REPAIR    . CARDIAC ELECTROPHYSIOLOGY MAPPING AND ABLATION    . HERNIA REPAIR    . PACEMAKER INSERTION    . PROSTATE SURGERY          Home Medications    Prior to Admission medications   Medication Sig Start Date End Date Taking? Authorizing Provider  citalopram (CELEXA) 20 MG tablet TK 1 T PO QD 02/09/16   [provider]  dofetilide (TIKOSYN) 500 MCG capsule Take by mouth.    [provider]  losartan (COZAAR) 50 MG tablet  03/04/16   [provider]  metFORMIN (GLUCOPHAGE-XR) 500 MG 24 hr tablet TAKE 2 TS PO BID 04/14/16   [provider]  NIFEdipine (PROCARDIA-XL/ADALAT-CC/NIFEDICAL-XL) 30 MG 24 hr tablet  04/14/16   [provider]  oseltamivir (TAMIFLU) 75 MG capsule Take 1 capsule (75 mg total) by mouth every 12 (twelve) hours for 5 days. 08/10/18 08/15/18  Alvira Monday, MD  simvastatin (ZOCOR) 40 MG tablet  02/10/16   [provider]  warfarin (COUMADIN) 5 MG tablet  02/16/16   [provider]    Family History No family history on file.  Social History Social History   Tobacco Use  . Smoking status: Former Games developer  . Smokeless tobacco: Never Used  Substance Use Topics  . Alcohol use: No  .  Drug use: No     Allergies   Clarithromycin; Other; and Penicillins   Review of Systems Review of Systems  Constitutional: Positive for appetite change, fatigue and fever.  HENT: Positive for congestion and sore throat.   Eyes: Negative for visual disturbance.  Respiratory: Positive for cough. Negative for shortness of breath.   Cardiovascular: Negative for chest pain.  Gastrointestinal: Positive for nausea. Negative for abdominal pain, diarrhea and vomiting.  Genitourinary: Negative for difficulty urinating.    Musculoskeletal: Positive for arthralgias and myalgias. Negative for back pain and neck stiffness.  Skin: Negative for rash.  Neurological: Positive for headaches (mild). Negative for syncope.     Physical Exam Updated Vital Signs BP 121/61 (BP Location: Left Arm)   Pulse 70   Temp 98.7 F (37.1 C) (Oral)   Resp 16   Ht 5\' 8"  (1.727 m)   Wt 83.9 kg   SpO2 95%   BMI 28.13 kg/m   Physical Exam Vitals signs and nursing note reviewed.  Constitutional:      General: He is not in acute distress.    Appearance: He is well-developed. He is not diaphoretic.  HENT:     Head: Normocephalic and atraumatic.  Eyes:     Conjunctiva/sclera: Conjunctivae normal.  Neck:     Musculoskeletal: Normal range of motion.  Cardiovascular:     Rate and Rhythm: Normal rate and regular rhythm.     Heart sounds: Normal heart sounds. No murmur. No friction rub. No gallop.   Pulmonary:     Effort: Pulmonary effort is normal. No respiratory distress.     Breath sounds: Normal breath sounds. No wheezing or rales.  Abdominal:     General: There is no distension.     Palpations: Abdomen is soft.     Tenderness: There is no abdominal tenderness. There is no guarding.  Skin:    General: Skin is warm and dry.  Neurological:     Mental Status: He is alert and oriented to person, place, and time.      ED Treatments / Results  Labs (all labs ordered are listed, but only abnormal results are displayed) Labs Reviewed  PROTIME-INR - Abnormal; Notable for the following components:      Result Value   Prothrombin Time 23.0 (*)    All other components within normal limits  CBC WITH DIFFERENTIAL/PLATELET - Abnormal; Notable for the following components:   WBC 10.9 (*)    RBC 3.87 (*)    Hemoglobin 11.6 (*)    HCT 36.6 (*)    Neutro Abs 8.3 (*)    Monocytes Absolute 1.4 (*)    All other components within normal limits  COMPREHENSIVE METABOLIC PANEL - Abnormal; Notable for the following components:    Sodium 133 (*)    Glucose, Bld 192 (*)    Calcium 8.4 (*)    All other components within normal limits  INFLUENZA PANEL BY PCR (TYPE A & B) - Abnormal; Notable for the following components:   Influenza B By PCR POSITIVE (*)    All other components within normal limits  GROUP A STREP BY PCR    EKG None  Radiology Dg Chest 2 View  Result Date: 08/10/2018 CLINICAL DATA:  78 year old with fever. EXAM: CHEST - 2 VIEW COMPARISON:  None. FINDINGS: Lungs are clear. There is a dual lead left cardiac pacemaker. Prior median sternotomy. Heart size is normal. Atherosclerotic calcifications at the aortic arch. No pleural effusions. Negative for a pneumothorax. IMPRESSION:  No active cardiopulmonary disease. Electronically Signed   By: Richarda Overlie M.D.   On: 08/10/2018 09:48    Procedures Procedures (including critical care time)  Medications Ordered in ED Medications - No data to display   Initial Impression / Assessment and Plan / ED Course  I have reviewed the triage vital signs and the nursing notes.  Pertinent labs & imaging results that were available during my care of the patient were reviewed by me and considered in my medical decision making (see chart for details).     78yo male presents with concern for body aches, fever, afib, DM, htn, who presents with concern for body aches, fever, congestion and fatigue. Labs without acute changes. XR without pneumonia.  Suspect influenza by history and exam.  Given tamiflu given age and comorbidities. Patient discharged in stable condition with understanding of reasons to return.   Final Clinical Impressions(s) / ED Diagnoses   Final diagnoses:  Upper respiratory tract infection, unspecified type  Influenza-like illness  Other fatigue    ED Discharge Orders         Ordered    oseltamivir (TAMIFLU) 75 MG capsule  Every 12 hours     08/10/18 0944           Alvira Monday, MD 08/10/18 2151

## 2018-08-10 NOTE — ED Notes (Signed)
Patient transported to X-ray 

## 2020-03-04 ENCOUNTER — Ambulatory Visit (INDEPENDENT_AMBULATORY_CARE_PROVIDER_SITE_OTHER): Payer: Medicare Other | Admitting: Family Medicine

## 2020-03-04 ENCOUNTER — Encounter: Payer: Self-pay | Admitting: Family Medicine

## 2020-03-04 ENCOUNTER — Other Ambulatory Visit: Payer: Self-pay

## 2020-03-04 VITALS — BP 135/64 | Ht 68.5 in | Wt 173.0 lb

## 2020-03-04 DIAGNOSIS — M545 Low back pain, unspecified: Secondary | ICD-10-CM

## 2020-03-04 NOTE — Progress Notes (Signed)
PCP: Bing Matter., MD  Subjective:   HPI: Patient is a 79 y.o. male here for low back pain.  Patient reports overall he has been doing extremely well over the past 2 years since doing physical therapy and home exercise program for his spinal stenosis. He states current pain in his low back feels different. He feels this more on the right side lower lumbar area and has been present for the past 1 to 2 months. No radiation into his lower extremities. No numbness or tingling. No bowel or bladder dysfunction. Has not had an acute injury. Pain is worse when seated and going to get up. He states Biofreeze has been helpful.  Past Medical History:  Diagnosis Date  . A-fib (HCC)   . Coronary artery disease   . DDD (degenerative disc disease), lumbar   . Diabetes mellitus without complication (HCC)   . Hypertension     Current Outpatient Medications on File Prior to Visit  Medication Sig Dispense Refill  . azelastine (ASTELIN) 0.1 % nasal spray Place 1 spray into both nostrils 2 (two) times daily.    . citalopram (CELEXA) 20 MG tablet TK 1 T PO QD  3  . dofetilide (TIKOSYN) 500 MCG capsule Take by mouth.    . losartan (COZAAR) 50 MG tablet   1  . metFORMIN (GLUCOPHAGE-XR) 500 MG 24 hr tablet TAKE 2 TS PO BID  0  . NIFEdipine (PROCARDIA-XL/ADALAT-CC/NIFEDICAL-XL) 30 MG 24 hr tablet   0  . simvastatin (ZOCOR) 40 MG tablet   3  . warfarin (COUMADIN) 5 MG tablet   0   No current facility-administered medications on file prior to visit.    Past Surgical History:  Procedure Laterality Date  . APPENDECTOMY    . ASD REPAIR    . CARDIAC ELECTROPHYSIOLOGY MAPPING AND ABLATION    . HERNIA REPAIR    . PACEMAKER INSERTION    . PROSTATE SURGERY      Allergies  Allergen Reactions  . Clarithromycin Palpitations and Other (See Comments)    BIAXIN ;  DOES NOT INTERACT WITH CURRENT MEDICATIONS  . Other Other (See Comments)  . Ciprofloxacin Other (See Comments)    Pt refused due to  family experiences and 1 family has died.   Marland Kitchen Penicillins     Social History   Socioeconomic History  . Marital status: Married    Spouse name: Not on file  . Number of children: Not on file  . Years of education: Not on file  . Highest education level: Not on file  Occupational History  . Not on file  Tobacco Use  . Smoking status: Former Games developer  . Smokeless tobacco: Never Used  Substance and Sexual Activity  . Alcohol use: No  . Drug use: No  . Sexual activity: Not on file  Other Topics Concern  . Not on file  Social History Narrative  . Not on file   Social Determinants of Health   Financial Resource Strain:   . Difficulty of Paying Living Expenses:   Food Insecurity:   . Worried About Programme researcher, broadcasting/film/video in the Last Year:   . Barista in the Last Year:   Transportation Needs:   . Freight forwarder (Medical):   Marland Kitchen Lack of Transportation (Non-Medical):   Physical Activity:   . Days of Exercise per Week:   . Minutes of Exercise per Session:   Stress:   . Feeling of Stress :   Social  Connections:   . Frequency of Communication with Friends and Family:   . Frequency of Social Gatherings with Friends and Family:   . Attends Religious Services:   . Active Member of Clubs or Organizations:   . Attends Banker Meetings:   Marland Kitchen Marital Status:   Intimate Partner Violence:   . Fear of Current or Ex-Partner:   . Emotionally Abused:   Marland Kitchen Physically Abused:   . Sexually Abused:     History reviewed. No pertinent family history.  BP (!) 135/64   Ht 5' 8.5" (1.74 m)   Wt 173 lb (78.5 kg)   BMI 25.92 kg/m   Review of Systems: See HPI above.     Objective:  Physical Exam:  Gen: NAD, comfortable in exam room  Back: No gross deformity, scoliosis. No tenderness palpation currently midline or paraspinal regions or SI joint. Range of motion limited to 70 degrees flexion but otherwise full and painless. Strength 5 out of 5 bilateral lower  extremities. Sensation intact to light touch bilaterally. Negative straight leg raise bilaterally. Negative logroll, FABER, FADIR.   Assessment & Plan:  1.  Low back pain: Consistent with right-sided lumbar strain.  He is doing much better today.  He should continue with Biofreeze and take Tylenol as needed.  He will follow-up as needed.

## 2020-03-04 NOTE — Patient Instructions (Signed)
You have a lumbar strain. Ok to take tylenol for baseline pain relief (1-2 extra strength tabs 3x/day) Topical biofreeze 4 times a day for next week then as needed. Stay as active as possible. Do home exercises and stretches as directed - hold each for 20-30 seconds and do each one three times. Strengthening of low back muscles, abdominal musculature are key for long term pain relief. Follow up with me as needed if you're doing well.

## 2020-03-08 ENCOUNTER — Ambulatory Visit: Payer: Medicare Other | Admitting: Family Medicine

## 2021-02-12 ENCOUNTER — Ambulatory Visit (INDEPENDENT_AMBULATORY_CARE_PROVIDER_SITE_OTHER): Payer: Medicare Other | Admitting: Family Medicine

## 2021-02-12 ENCOUNTER — Encounter: Payer: Self-pay | Admitting: Family Medicine

## 2021-02-12 ENCOUNTER — Other Ambulatory Visit: Payer: Self-pay

## 2021-02-12 VITALS — BP 132/58 | Ht 69.0 in | Wt 175.0 lb

## 2021-02-12 DIAGNOSIS — M79605 Pain in left leg: Secondary | ICD-10-CM

## 2021-02-12 NOTE — Patient Instructions (Addendum)
Great seeing you again, Paul Chan.  Let's work on some rehab to strengthen your core and back muscles. I attached some stretches to do and Aundra Millet will give you a handout.  Do these stretching exercises 1-2x daily. If it is painful (more than a 3/10, do not do them). Try these at home and if it is not getting better, let us know and we can try formal physical therapy.  Follow up in the clinic if not improving.

## 2021-02-12 NOTE — Progress Notes (Signed)
PCP: Bing Matter., MD  Subjective:   HPI: Patient is a 80 y.o. male here for left posterior buttock/leg discomfort.  Patient reports that he has had years of chronic low back pain, however over the past month or so he has noticed some tingling sensation in his posterior lateral left hip and leg as well. He denies any pain; no inciting event or injury. He notices the tingling sensation down his posterior-lateral leg from his buttock area to his posterior-lateral knee, sometimes down the lower leg to the ankle; denies any radiation into the foot. His discomfort is most noticeable when he is seated. Discomfort does not wake him up at night. Denies any weakness of lower extremity or giving way. He does not take any medication for this and does not wish too. He does do back stretches/exercises some mornings which at times will help his back and hip.  He denies any bowel or bladder incontinence, no weakness or recent falls.  Past Medical History:  Diagnosis Date   A-fib [facility])    Coronary artery disease    DDD (degenerative disc disease), lumbar    Diabetes mellitus without complication (HCC)    Hypertension     Current Outpatient Medications on File Prior to Visit  Medication Sig Dispense Refill   azelastine (ASTELIN) 0.1 % nasal spray Place 1 spray into both nostrils 2 (two) times daily.     citalopram (CELEXA) 20 MG tablet TK 1 T PO QD  3   dofetilide (TIKOSYN) 500 MCG capsule Take by mouth.     losartan (COZAAR) 50 MG tablet   1   metFORMIN (GLUCOPHAGE-XR) 500 MG 24 hr tablet TAKE 2 TS PO BID  0   NIFEdipine (PROCARDIA-XL/ADALAT-CC/NIFEDICAL-XL) 30 MG 24 hr tablet   0   simvastatin (ZOCOR) 40 MG tablet   3   warfarin (COUMADIN) 5 MG tablet   0   No current facility-administered medications on file prior to visit.    Past Surgical History:  Procedure Laterality Date   APPENDECTOMY     ASD REPAIR     CARDIAC ELECTROPHYSIOLOGY MAPPING AND ABLATION     HERNIA REPAIR     PACEMAKER  INSERTION     PROSTATE SURGERY      Allergies  Allergen Reactions   Clarithromycin Palpitations and Other (See Comments)    BIAXIN ;  DOES NOT INTERACT WITH CURRENT MEDICATIONS   Other Other (See Comments)   Ciprofloxacin Other (See Comments)    Pt refused due to family experiences and 1 family has died.    Penicillins     Social History   Socioeconomic History   Marital status: Married    Spouse name: Not on file   Number of children: Not on file   Years of education: Not on file   Highest education level: Not on file  Occupational History   Not on file  Tobacco Use   Smoking status: Former    Pack years: 0.00   Smokeless tobacco: Never  Substance and Sexual Activity   Alcohol use: No   Drug use: No   Sexual activity: Not on file  Other Topics Concern   Not on file  Social History Narrative   Not on file   Social Determinants of Health   Financial Resource Strain: Not on file  Food Insecurity: Not on file  Transportation Needs: Not on file  Physical Activity: Not on file  Stress: Not on file  Social Connections: Not on file  Intimate  Partner Violence: Not on file    No family history on file.  BP (!) 132/58   Ht 5\' 9"  (1.753 m)   Wt 175 lb (79.4 kg)   BMI 25.84 kg/m   No flowsheet data found.  No flowsheet data found.  Review of Systems: See HPI above.     Objective:  Physical Exam:  Gen: NAD, comfortable in exam room CV: RR Resp: breathing unlabored on room air Psych: pleasant and appropriate in conversation; normal affect and mood Neuro: 2/4 patellar tendon and achilles tendon DTR's MSK:  - Lumbar spine/LLE: - Inspection: no gross deformity or asymmetry, swelling or ecchymosis. No skin changes - Palpation: No TTP over the spinous processes, paraspinal muscles, or SI joints b/l. No TTP over piriformis muscle belly; no TTP over greater trochanter. - ROM: slightly limited active ROM of the lumbar spine in flexion and extension but without  pain - Strength: 5/5 strength of lower extremity in L4-S1 nerve root distributions b/l; full strength of hip and knee flex/extension - Neuro: sensation intact in the L4-S1 nerve root distribution b/l, 2+ patellar and achilles tendon reflexes reflexes  - Provocative testing: negative SLR, negative modified slump's test; negative Ober's test    Assessment & Plan:  Lumbar radiculopathy, L5-S1 - acute on chronic. History of chronic LBP, last X-ray few years ago showing mild-mod lumbar stenosis. No red flag symptoms. Radiation of tingling in L5-S1 distribution, but no muscle weakness; no evidence of piriformis syndrome or meralgia paresthetica. Patient requests non-medicinal treatment. - exercises demonstrated with handout for LBP stretches and abdominal core strengthening; will do trial of home exercises, if not improving will send to formal PT - may take Tylenol as needed - return precautions discussed; f/u as needed if symptoms not improving  Patient seen and examined with Dr. .    Pearletha Forge, DO; Sports Medicine Fellow

## 2022-01-12 ENCOUNTER — Ambulatory Visit (INDEPENDENT_AMBULATORY_CARE_PROVIDER_SITE_OTHER): Payer: Medicare Other | Admitting: Family Medicine

## 2022-01-12 ENCOUNTER — Ambulatory Visit: Payer: Medicare Other | Admitting: Family Medicine

## 2022-01-12 VITALS — BP 134/68 | Ht 69.0 in | Wt 173.0 lb

## 2022-01-12 DIAGNOSIS — S99921A Unspecified injury of right foot, initial encounter: Secondary | ICD-10-CM | POA: Diagnosis not present

## 2022-01-12 DIAGNOSIS — G8929 Other chronic pain: Secondary | ICD-10-CM

## 2022-01-12 DIAGNOSIS — R252 Cramp and spasm: Secondary | ICD-10-CM | POA: Diagnosis not present

## 2022-01-12 DIAGNOSIS — M5440 Lumbago with sciatica, unspecified side: Secondary | ICD-10-CM

## 2022-01-12 NOTE — Progress Notes (Unsigned)
PCP: Yolanda Manges, DO  Subjective:   HPI: Patient is a 81 y.o. male here for follow-up on chronic low back pain.  Patient states that the pain does not occur daily but "just occasionally".  Previously had radiation to his left leg, however, most recently has been radiating down the outside of his right thigh.  The pain only last for a few minutes and then self subsides.  States that the pain is worse with bending, often feels stiff.  Does not awaken from sleep.  No injuries that he can recall.  Also notes previous cramping in his left calf. He became more concerned though then he noticed cramping to his right foot. This was very painful to him. States that his right foot would flex upwards and his toes would extend.   Lastly, he notes that about 2 weeks ago while he was shopping at [employer] [employer], a cooler from the lower shelf fell onto his right foot.  States that the 3 middle toes turned black/blue and he had a "big knot".  He was able to keep walking on it afterwards.  States that it was very painful but the pain has thankfully improved.  Putting pressure on those toes does exacerbate the pain slightly.  He does take warfarin for his A-fib.   Past Medical History:  Diagnosis Date   A-fib [facility])    Coronary artery disease    DDD (degenerative disc disease), lumbar    Diabetes mellitus without complication (HCC)    Hypertension     Current Outpatient Medications on File Prior to Visit  Medication Sig Dispense Refill   azelastine (ASTELIN) 0.1 % nasal spray Place 1 spray into both nostrils 2 (two) times daily.     citalopram (CELEXA) 20 MG tablet TK 1 T PO QD  3   dofetilide (TIKOSYN) 500 MCG capsule Take by mouth.     losartan (COZAAR) 50 MG tablet   1   metFORMIN (GLUCOPHAGE-XR) 500 MG 24 hr tablet TAKE 2 TS PO BID  0   NIFEdipine (PROCARDIA-XL/ADALAT-CC/NIFEDICAL-XL) 30 MG 24 hr tablet   0   simvastatin (ZOCOR) 40 MG tablet   3   warfarin (COUMADIN) 5 MG tablet   0   No current  facility-administered medications on file prior to visit.    Past Surgical History:  Procedure Laterality Date   APPENDECTOMY     ASD REPAIR     CARDIAC ELECTROPHYSIOLOGY MAPPING AND ABLATION     HERNIA REPAIR     PACEMAKER INSERTION     PROSTATE SURGERY      Allergies  Allergen Reactions   Clarithromycin Palpitations and Other (See Comments)    BIAXIN ;  DOES NOT INTERACT WITH CURRENT MEDICATIONS   Other Other (See Comments)   Ciprofloxacin Other (See Comments)    Pt refused due to family experiences and 1 family has died.    Penicillins     BP 134/68   Ht 5\' 9"  (1.753 m)   Wt 173 lb (78.5 kg)   BMI 25.55 kg/m       View : No data to display.              View : No data to display.              Objective:  Physical Exam:  Gen: NAD, comfortable in exam room, kyphosis Gait: kyphosis-forward bending with gait, smaller steps but otherwise unremarkable Back Exam:  Inspection: Unremarkable  Palpable tenderness: None. Range  of Motion:  Flexion 40-45 deg; Extension 20 deg; Side Bending to 30 deg bilaterally; Rotation to 45 deg bilaterally  Leg strength: Quad: 5/5 Hamstring: 5/5 Strength at foot: Plantar-flexion: 5/5 Dorsi-flexion: 5/5 Eversion: 5/5 Inversion: 5/5  Sensory change: Gross sensation intact to all lumbar and sacral dermatomes.  Reflexes: 1+ at both patellar tendons, 1+ at achilles tendons, Babinski's downgoing.  Gait unremarkable. SLR laying: Negative  XSLR laying: Negative  FABER: negative.  ***   Assessment & Plan:  1. ***

## 2022-01-12 NOTE — Patient Instructions (Signed)
You have a hematoma of your foot but I expect this to resolve over the next couple weeks.  For the cramping hopefully this continues to improve. If this comes on, focus on massaging the front of your lower leg. Let me know if this doesn't improve though and we would consider adding some strengthening and stretching exercises to this.  I'm glad the low back is improving. Tylenol 500mg  as needed. Follow up with me as needed.

## 2022-01-13 ENCOUNTER — Encounter: Payer: Self-pay | Admitting: Family Medicine

## 2022-05-04 ENCOUNTER — Ambulatory Visit (INDEPENDENT_AMBULATORY_CARE_PROVIDER_SITE_OTHER): Payer: Medicare Other | Admitting: Family Medicine

## 2022-05-04 VITALS — BP 128/60 | Ht 69.0 in | Wt 171.0 lb

## 2022-05-04 DIAGNOSIS — M25552 Pain in left hip: Secondary | ICD-10-CM

## 2022-05-04 NOTE — Patient Instructions (Signed)
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 Ice over area of pain 3-4 times a day for 15 minutes at a time only if needed. Do home exercises and stretches most days of the week for next 6 weeks. Tylenol as needed for pain. If not improving, can consider physical therapy. Follow up with me in 6 weeks.

## 2022-05-04 NOTE — Progress Notes (Unsigned)
  Paul Chan - 81 y.o. male MRN [id number]  Date of birth: 02-17-1941    CHIEF COMPLAINT:   lateral left leg pain    SUBJECTIVE:   HPI:  Pain located on side of left leg. Reminds him of when all his back pain first started. The pain doesn't last, but comes and goes. Pain is sharp and intense lasting a few minutes and then goes away. This has been an issue for last 2-3 weeks. Notices pain after long car drive, when getting out to walk, doesn't last long.    ROS:     See HPI  PERTINENT  PMH / PSH FH / / SH:  Past Medical, Surgical, Social, and Family History Reviewed & Updated in the EMR.  Pertinent findings include:    OBJECTIVE: BP 128/60   Ht 5\' 9"  (1.753 m)   Wt 171 lb (77.6 kg)   BMI 25.25 kg/m   Physical Exam:  Vital signs are reviewed.  GEN: Alert and oriented, NAD Pulm: Breathing unlabored PSY: normal mood, congruent affect  MSK: Back: No TTP over back, FROM with lateral turning, good flexion/extension and no pain Left Leg: no deformity, swelling, or bruising. No TTP in IT Band, no pain with varus/valgus force of knee, no pain with abduction/adduction, no pain with flexing/extending LE, strength 5/5 in hip and knee  ASSESSMENT & PLAN:  1. IT Band Syndrome Given patient's symptoms and presentation, most consistent with IT Band syndrome. Patient's pain intermittent, and would likely respond to conservative home stretches/exercises -IT Band exercises.   Holley Bouche, MD PGY-2, [facility] Resident [place]

## 2022-05-05 ENCOUNTER — Encounter: Payer: Self-pay | Admitting: Family Medicine

## 2022-06-15 ENCOUNTER — Ambulatory Visit: Payer: Medicare Other | Admitting: Family Medicine

## 2022-06-22 ENCOUNTER — Ambulatory Visit: Payer: Medicare Other | Admitting: Family Medicine

## 2022-06-22 ENCOUNTER — Ambulatory Visit (INDEPENDENT_AMBULATORY_CARE_PROVIDER_SITE_OTHER): Payer: Medicare Other | Admitting: Family Medicine

## 2022-06-22 VITALS — BP 122/62 | Ht 68.5 in | Wt 172.0 lb

## 2022-06-22 DIAGNOSIS — M25551 Pain in right hip: Secondary | ICD-10-CM

## 2022-06-22 DIAGNOSIS — M25512 Pain in left shoulder: Secondary | ICD-10-CM | POA: Diagnosis present

## 2022-06-22 NOTE — Patient Instructions (Signed)
You have a mild overuse strain of your left pectoralis muscle. Don't worry about this, it should continue to get better. You may have an irritated nerve on the right side going into this quad muscle. Do the easy quad strengthening exercises 3-4 times a week. Keep me updated but hopefully just coming in here has scared this away too :)

## 2022-06-23 ENCOUNTER — Encounter: Payer: Self-pay | Admitting: Family Medicine

## 2022-06-23 NOTE — Progress Notes (Signed)
PCP: Paul Manges, DO  Subjective:   HPI: Patient is a 81 y.o. male here for left shoulder, right hip pain.  Patient reports he's had over a week of left anterior shoulder pain - points to chest wall as area of pain. Worse with physical activity - he lifted boxes and other items helping his son move and noted pain worse after this but not during. Does not radiate down arm. Saw PCP for this and told he had strained a muscle. No numbness/tingling. Also with anterior right thigh pain that at times grabs him and feels like his leg is going to buckle. He does have history of spinal stenosis - hasn't felt obvious back pain at same time as this. No current pain. Not taken anything for this. No numbness or tingling.  Past Medical History:  Diagnosis Date   A-fib [facility])    Coronary artery disease    DDD (degenerative disc disease), lumbar    Diabetes mellitus without complication (HCC)    Hypertension     Current Outpatient Medications on File Prior to Visit  Medication Sig Dispense Refill   azelastine (ASTELIN) 0.1 % nasal spray Place 1 spray into both nostrils 2 (two) times daily.     citalopram (CELEXA) 20 MG tablet TK 1 T PO QD  3   dofetilide (TIKOSYN) 500 MCG capsule Take by mouth.     losartan (COZAAR) 50 MG tablet   1   metFORMIN (GLUCOPHAGE-XR) 500 MG 24 hr tablet TAKE 2 TS PO BID  0   NIFEdipine (PROCARDIA-XL/ADALAT-CC/NIFEDICAL-XL) 30 MG 24 hr tablet   0   simvastatin (ZOCOR) 40 MG tablet   3   warfarin (COUMADIN) 5 MG tablet   0   No current facility-administered medications on file prior to visit.    Past Surgical History:  Procedure Laterality Date   APPENDECTOMY     ASD REPAIR     CARDIAC ELECTROPHYSIOLOGY MAPPING AND ABLATION     HERNIA REPAIR     PACEMAKER INSERTION     PROSTATE SURGERY      Allergies  Allergen Reactions   Clarithromycin Palpitations and Other (See Comments)    BIAXIN ;  DOES NOT INTERACT WITH CURRENT MEDICATIONS   Other Other (See  Comments)   Ciprofloxacin Other (See Comments)    Pt refused due to family experiences and 1 family has died.    Penicillins     BP 122/62   Ht 5' 8.5" (1.74 m)   Wt 172 lb (78 kg)   BMI 25.77 kg/m       No data to display              No data to display              Objective:  Physical Exam:  Gen: NAD, comfortable in exam room  Left shoulder: No swelling, ecchymoses.  No gross deformity. Minimal TTP pec major.  No other tenderness. FROM without pain. Negative Hawkins, Neers. Negative Yergasons. Strength 5/5 with empty can and resisted internal/external rotation. NV intact distally.  Right hip: No deformity. FROM with 5/5 strength hip flexion and knee extension. No tenderness to palpation. NVI distally. Negative logroll   Assessment & Plan:  1. Left shoulder pain - 2/2 overuse pec major strain without tear.  He is doing much better.  Reassured.  Would hold off on exercises for this given how improved it is.  2. Right quad pain - exam benign currently.  Quad strengthening exercises  provided.  Tylenol if needed.  Discussed possibility this is coming from his back but will monitor if this recurs/worsens.

## 2022-09-14 ENCOUNTER — Ambulatory Visit (INDEPENDENT_AMBULATORY_CARE_PROVIDER_SITE_OTHER): Payer: Medicare Other | Admitting: Family Medicine

## 2022-09-14 VITALS — BP 139/53 | Ht 68.5 in | Wt 173.0 lb

## 2022-09-14 DIAGNOSIS — M545 Low back pain, unspecified: Secondary | ICD-10-CM

## 2022-09-14 DIAGNOSIS — R319 Hematuria, unspecified: Secondary | ICD-10-CM

## 2022-09-14 NOTE — Patient Instructions (Addendum)
[facility] - [facility] [address] [place], [place] [address] [phone]  Appt: Today 09/14/22 @ 2:15 pm  Take tylenol 500mg  1-2 tabs three times a day if needed. Heat 15 minutes at a time as needed. Consider physical therapy and/or a mild pain medicine if not improving through this week - call me if this is the case.

## 2022-09-14 NOTE — Progress Notes (Unsigned)
  Paul Chan - 82 y.o. male MRN [id number]  Date of birth: August 03, 1941  SUBJECTIVE:   CC: Right flank/back pain  HPI: Constant discomfort on R side/flank, worse with movement. Bending over or lifting his R leg up is uncomfortable. Tylenol has helped pain, knows not to take NSAIDs. No shooting sensation down legs. Was moving furniture on Saturday, but back discomfort had been present the week before. Can't remember any inciting event or extra activity over the past week. Discomfort was worst yesterday on Sun and even considered going to ER.   Noted he had brown tinged urine a couple of times over the past week, then on Fri/Sat noticed his urine was bright red. Since then, his urine has been normal. Denies fevers or dysuria.    HISTORY: Past Medical, Surgical, Social, and Family History Reviewed & Updated per EMR.   Pertinent Historical Findings include: Atrial fibrillation, on warfarin CAD, T2DM, HTN H/o prostate surgery   PHYSICAL EXAM:  VS: BP:(!) 139/53  HR: bpm  TEMP: ( )  RESP:   HT:5' 8.5" (174 cm)   WT:173 lb (78.5 kg)  BMI:25.92 PHYSICAL EXAM: Gen: Elderly male, NAD Abd: Soft, nontender, nondistended. Slight R CVA tenderness.  MSK: - Lower back: NTTP midline and on flank. No pain on lumbar spinal and hip extension. Discomfort on flexion to touch toes.  - R Hip: No deformities or swelling. NTTP along hip or lower back. FROM. Strength 4/5 with hip flexion. NVI. Negative logroll - R Knee: No deformities or swelling. NTTP. FROM. Strength 5/5 on flexion and extension, symmetric with L knee. NVI.    ASSESSMENT & PLAN: See problem based charting & AVS for pt instructions.  R back/flank pain - with patient reported history of hematuria and otherwise benign MSK exam, there is concern this is of renal etiology. Has a urologist, but no appointment available with his provider for a few months. Contacted his urology office for appointment today with urology PA for further evaluation.  Otherwise advised that he can continue Tylenol, ice/heat 15 minutes at a time, and activity modification. Offered other pain medication, currently declines. Can consider PT if no urological abnormalities and if symptoms do not improve over next week.   Estevan Oaks, [place] [facility]

## 2022-09-15 ENCOUNTER — Encounter: Payer: Self-pay | Admitting: Family Medicine

## 2023-05-03 ENCOUNTER — Ambulatory Visit (INDEPENDENT_AMBULATORY_CARE_PROVIDER_SITE_OTHER): Payer: Medicare Other | Admitting: Family Medicine

## 2023-05-03 VITALS — BP 138/60 | Ht 69.0 in | Wt 170.0 lb

## 2023-05-03 DIAGNOSIS — M545 Low back pain, unspecified: Secondary | ICD-10-CM

## 2023-05-03 MED ORDER — METHYLPREDNISOLONE ACETATE 80 MG/ML IJ SUSP
80.0000 mg | Freq: Once | INTRAMUSCULAR | Status: AC
  Administered 2023-05-03: 80 mg via INTRAMUSCULAR

## 2023-05-03 NOTE — Progress Notes (Unsigned)
PCP: Yolanda Manges, DO  Subjective:   HPI: Patient is a 82 y.o. male here for follow-up lumbar back pain.  Previously seen in 2017 for lumbar back pain.  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  Left side of back is really hurting. Having difficulty walking, worse with walking. Noted some soreness last Tuesday and Wednesday. Then got really bad last Thursday. No injuries or trauma. Went to [facility] hospital Monday night for sleep study for sleep apnea evaluation.  Pain does not shoot down his leg. Describes the pain as ache. Improves with flexion of back, worse with extension. Is on both sides of back. Took Tylenol 650 twice a day for last 3-4 days, also took Flexeril last night. Both did not help.   Past Medical History:  Diagnosis Date   A-fib [facility])    Coronary artery disease    DDD (degenerative disc disease), lumbar    Diabetes mellitus without complication (HCC)    Hypertension     Current Outpatient Medications on File Prior to Visit  Medication Sig Dispense Refill   azelastine (ASTELIN) 0.1 % nasal spray Place 1 spray into both nostrils 2 (two) times daily.     citalopram (CELEXA) 20 MG tablet TK 1 T PO QD  3   dofetilide (TIKOSYN) 500 MCG capsule Take by mouth.     losartan (COZAAR) 50 MG tablet   1   metFORMIN (GLUCOPHAGE-XR) 500 MG 24 hr tablet TAKE 2 TS PO BID  0   NIFEdipine (PROCARDIA-XL/ADALAT-CC/NIFEDICAL-XL) 30 MG 24 hr tablet   0   simvastatin (ZOCOR) 40 MG tablet   3   warfarin (COUMADIN) 5 MG tablet   0   No current facility-administered medications on file prior to visit.    Past Surgical History:  Procedure Laterality Date   APPENDECTOMY     ASD REPAIR     CARDIAC ELECTROPHYSIOLOGY MAPPING AND ABLATION     HERNIA REPAIR     PACEMAKER INSERTION     PROSTATE SURGERY      Allergies  Allergen Reactions   Clarithromycin Palpitations and Other (See  Comments)    BIAXIN ;  DOES NOT INTERACT WITH CURRENT MEDICATIONS   Other Other (See Comments)   Ciprofloxacin Other (See Comments)    Pt refused due to family experiences and 1 family has died.    Penicillins     BP 138/60   Ht 5\' 9"  (1.753 m)   Wt 170 lb (77.1 kg)   BMI 25.10 kg/m       No data to display              No data to display              Objective:  Physical Exam:  Gen: NAD, comfortable in exam room  Lumbar spine Inspection: No obvious deformity, step-off, ecchymosis, erythema, swelling Palpation: Nontender to palpation along spinous processes, paraspinal lumbar muscles Range of Motion: Limited back extension due to pain Full flexion of back Special Tests Negative straight leg raise Strength: Hip flexion 4/5 on right side Other lumbosacral plexus strength nerve distributions 5/5    Assessment & Plan:   Assessment & Plan Low back pain, unspecified back pain laterality, unspecified chronicity, unspecified whether sciatica present Suspect that patient's degenerative spine disease has worsened and was positionally aggravated by sleep study 1 week ago, resulting in patient's ongoing symptoms consistent with  sciatica and spinal stenosis.    Unfortunately, patient is not a candidate for anti-inflammatory medications as he is on warfarin.  Discussed that he may continue Tylenol, and he should restart physical therapy.  Given limited oral pain medication options, patient was given therapeutic IM lumbar methylprednisone injection today.  Consider oral prednisone taper if this does not improve his symptoms.

## 2023-05-03 NOTE — Patient Instructions (Signed)
You were given an injection of a steroid today. Continue the tylenol - 1-2 tabs three times a day as needed. Heat 15 minutes at a time 3-4 times a day as needed. Start physical therapy. Call me within the next week if you're not improving - would consider an oral steroid dose pack.

## 2023-05-03 NOTE — Assessment & Plan Note (Signed)
Suspect that patient's degenerative spine disease has worsened and was positionally aggravated by sleep study 1 week ago, resulting in patient's ongoing symptoms consistent with sciatica and spinal stenosis.    Unfortunately, patient is not a candidate for anti-inflammatory medications as he is on warfarin.  Discussed that he may continue Tylenol, and he should restart physical therapy.  Given limited oral pain medication options, patient was given therapeutic IM lumbar methylprednisone injection today.  Consider oral prednisone taper if this does not improve his symptoms.

## 2023-05-04 ENCOUNTER — Encounter: Payer: Self-pay | Admitting: Family Medicine

## 2023-06-28 ENCOUNTER — Ambulatory Visit (INDEPENDENT_AMBULATORY_CARE_PROVIDER_SITE_OTHER): Payer: Medicare Other | Admitting: Family Medicine

## 2023-06-28 VITALS — BP 136/67 | Ht 69.0 in | Wt 173.0 lb

## 2023-06-28 DIAGNOSIS — R200 Anesthesia of skin: Secondary | ICD-10-CM | POA: Diagnosis present

## 2023-06-28 DIAGNOSIS — R202 Paresthesia of skin: Secondary | ICD-10-CM | POA: Diagnosis not present

## 2023-06-28 NOTE — Patient Instructions (Signed)
You have a digital neuritis of your thumb. No restrictions on activity - if this spreads more give me a call though I don't expect that to happen. Your leg pain is likely coming from a nerve in your back when it gets irritated. Keep up the good work with physical therapy.

## 2023-06-29 NOTE — Progress Notes (Signed)
PCP: Yolanda Manges, DO  Subjective:   HPI: Patient is a 82 y.o. male here for right thumb numbness.  Patient reports he's unsure when symptoms started but for past few weeks he's noticed numbness on radial side of his right thumb, proximal phalanx area. No obvious trauma. No weakness. No other numbness.  No speech or vision changes. Not worse any particular time of day. Notices it more with gripping.  He's also had intermittent right lateral thigh pain that can be severe - nothing seems to bring this on.  No pain currently here. Back has been improving with physical therapy.  Past Medical History:  Diagnosis Date   A-fib [facility])    Coronary artery disease    DDD (degenerative disc disease), lumbar    Diabetes mellitus without complication (HCC)    Hypertension     Current Outpatient Medications on File Prior to Visit  Medication Sig Dispense Refill   azelastine (ASTELIN) 0.1 % nasal spray Place 1 spray into both nostrils 2 (two) times daily.     citalopram (CELEXA) 20 MG tablet TK 1 T PO QD  3   dofetilide (TIKOSYN) 500 MCG capsule Take by mouth.     losartan (COZAAR) 50 MG tablet   1   metFORMIN (GLUCOPHAGE-XR) 500 MG 24 hr tablet TAKE 2 TS PO BID  0   NIFEdipine (PROCARDIA-XL/ADALAT-CC/NIFEDICAL-XL) 30 MG 24 hr tablet   0   simvastatin (ZOCOR) 40 MG tablet   3   warfarin (COUMADIN) 5 MG tablet   0   No current facility-administered medications on file prior to visit.    Past Surgical History:  Procedure Laterality Date   APPENDECTOMY     ASD REPAIR     CARDIAC ELECTROPHYSIOLOGY MAPPING AND ABLATION     HERNIA REPAIR     PACEMAKER INSERTION     PROSTATE SURGERY      Allergies  Allergen Reactions   Clarithromycin Palpitations and Other (See Comments)    BIAXIN ;  DOES NOT INTERACT WITH CURRENT MEDICATIONS   Other Other (See Comments)   Ciprofloxacin Other (See Comments)    Pt refused due to family experiences and 1 family has died.    Penicillins     BP  136/67   Ht 5\' 9"  (1.753 m)   Wt 173 lb (78.5 kg)   BMI 25.55 kg/m       No data to display              No data to display              Objective:  Physical Exam:  Gen: NAD, comfortable in exam room  Right hand: No deformity, swelling, bruising. FROM with 5/5 strength IP, MCP joints. No tenderness to palpation. Sensation diminished focally over radial aspect of proximal phalanx 1st digit.  Intact over distal phalanx, rest of hand. Negative tinels and phalens.  Right leg: No deformity. FROM hip without pain. No tenderness to palpation lateral thigh.    Assessment & Plan:  1. Right thumb numbness - consistent with a digital neuritis.  No evidence/symptoms to suggest stroke or peripheral neuropathy.  No restrictions.  2. Right leg pain - consistent with pain from his spinal stenosis.  Continue his physical therapy.  Tylenol if needed.
